# Patient Record
Sex: Female | Born: 1935 | Race: White | Hispanic: No | Smoking: Never smoker
Health system: Southern US, Community
[De-identification: ages and names within clinical notes are randomized; demographics above are authoritative.]

## PROBLEM LIST (undated history)

## (undated) DIAGNOSIS — E0590 Thyrotoxicosis, unspecified without thyrotoxic crisis or storm: Secondary | ICD-10-CM

## (undated) DIAGNOSIS — J45909 Unspecified asthma, uncomplicated: Secondary | ICD-10-CM

## (undated) DIAGNOSIS — C801 Malignant (primary) neoplasm, unspecified: Secondary | ICD-10-CM

## (undated) DIAGNOSIS — I10 Essential (primary) hypertension: Secondary | ICD-10-CM

## (undated) DIAGNOSIS — E785 Hyperlipidemia, unspecified: Secondary | ICD-10-CM

## (undated) DIAGNOSIS — E079 Disorder of thyroid, unspecified: Secondary | ICD-10-CM

## (undated) HISTORY — PX: NASAL ENDOSCOPY: SHX286

## (undated) HISTORY — PX: ABDOMINAL HYSTERECTOMY: SHX81

## (undated) HISTORY — PX: URETHROPEXY: SHX1081

## (undated) HISTORY — DX: Hyperlipidemia, unspecified: E78.5

## (undated) HISTORY — DX: Essential (primary) hypertension: I10

## (undated) HISTORY — DX: Thyrotoxicosis, unspecified without thyrotoxic crisis or storm: E05.90

## (undated) HISTORY — PX: OOPHORECTOMY: SHX86

## (undated) HISTORY — PX: TONSILLECTOMY: SUR1361

## (undated) HISTORY — DX: Disorder of thyroid, unspecified: E07.9

## (undated) HISTORY — PX: NASAL SINUS SURGERY: SHX719

## (undated) HISTORY — PX: PELVIC FLOOR REPAIR: SHX2192

---

## 1998-12-18 ENCOUNTER — Other Ambulatory Visit: Admission: RE | Admit: 1998-12-18 | Discharge: 1998-12-18 | Payer: Self-pay | Admitting: *Deleted

## 1999-08-04 ENCOUNTER — Encounter: Admission: RE | Admit: 1999-08-04 | Discharge: 1999-08-04 | Payer: Self-pay | Admitting: Internal Medicine

## 1999-08-04 ENCOUNTER — Encounter: Payer: Self-pay | Admitting: Internal Medicine

## 2000-08-10 ENCOUNTER — Encounter: Admission: RE | Admit: 2000-08-10 | Discharge: 2000-08-10 | Payer: Self-pay | Admitting: Internal Medicine

## 2000-08-10 ENCOUNTER — Encounter: Payer: Self-pay | Admitting: Internal Medicine

## 2001-08-16 ENCOUNTER — Encounter: Payer: Self-pay | Admitting: Internal Medicine

## 2001-08-16 ENCOUNTER — Encounter: Admission: RE | Admit: 2001-08-16 | Discharge: 2001-08-16 | Payer: Self-pay | Admitting: Internal Medicine

## 2002-02-13 ENCOUNTER — Other Ambulatory Visit: Admission: RE | Admit: 2002-02-13 | Discharge: 2002-02-13 | Payer: Self-pay | Admitting: Family Medicine

## 2002-08-23 ENCOUNTER — Encounter: Payer: Self-pay | Admitting: Family Medicine

## 2002-08-23 ENCOUNTER — Encounter: Admission: RE | Admit: 2002-08-23 | Discharge: 2002-08-23 | Payer: Self-pay | Admitting: Family Medicine

## 2003-11-07 ENCOUNTER — Other Ambulatory Visit: Admission: RE | Admit: 2003-11-07 | Discharge: 2003-11-07 | Payer: Self-pay | Admitting: Family Medicine

## 2003-11-12 ENCOUNTER — Encounter: Admission: RE | Admit: 2003-11-12 | Discharge: 2003-11-12 | Payer: Self-pay | Admitting: Family Medicine

## 2003-12-10 ENCOUNTER — Encounter (INDEPENDENT_AMBULATORY_CARE_PROVIDER_SITE_OTHER): Payer: Self-pay | Admitting: Specialist

## 2003-12-10 ENCOUNTER — Ambulatory Visit (HOSPITAL_BASED_OUTPATIENT_CLINIC_OR_DEPARTMENT_OTHER): Admission: RE | Admit: 2003-12-10 | Discharge: 2003-12-10 | Payer: Self-pay | Admitting: Otolaryngology

## 2003-12-10 ENCOUNTER — Ambulatory Visit (HOSPITAL_COMMUNITY): Admission: RE | Admit: 2003-12-10 | Discharge: 2003-12-10 | Payer: Self-pay | Admitting: Otolaryngology

## 2004-10-08 ENCOUNTER — Ambulatory Visit: Payer: Self-pay | Admitting: Family Medicine

## 2004-11-17 ENCOUNTER — Encounter: Admission: RE | Admit: 2004-11-17 | Discharge: 2004-11-17 | Payer: Self-pay | Admitting: Family Medicine

## 2005-07-06 ENCOUNTER — Ambulatory Visit: Payer: Self-pay | Admitting: Family Medicine

## 2005-07-14 ENCOUNTER — Encounter: Admission: RE | Admit: 2005-07-14 | Discharge: 2005-07-14 | Payer: Self-pay | Admitting: Family Medicine

## 2005-10-13 ENCOUNTER — Ambulatory Visit: Payer: Self-pay | Admitting: Family Medicine

## 2005-11-18 ENCOUNTER — Encounter: Admission: RE | Admit: 2005-11-18 | Discharge: 2005-11-18 | Payer: Self-pay | Admitting: Family Medicine

## 2005-12-07 ENCOUNTER — Ambulatory Visit: Payer: Self-pay | Admitting: Family Medicine

## 2005-12-21 ENCOUNTER — Ambulatory Visit: Payer: Self-pay | Admitting: Internal Medicine

## 2005-12-21 ENCOUNTER — Ambulatory Visit: Payer: Self-pay | Admitting: Family Medicine

## 2006-01-06 ENCOUNTER — Ambulatory Visit: Payer: Self-pay | Admitting: Internal Medicine

## 2006-06-08 ENCOUNTER — Ambulatory Visit: Payer: Self-pay | Admitting: Family Medicine

## 2006-09-27 ENCOUNTER — Ambulatory Visit: Payer: Self-pay | Admitting: Family Medicine

## 2006-11-22 ENCOUNTER — Encounter: Admission: RE | Admit: 2006-11-22 | Discharge: 2006-11-22 | Payer: Self-pay | Admitting: Family Medicine

## 2006-12-25 ENCOUNTER — Emergency Department (HOSPITAL_COMMUNITY): Admission: EM | Admit: 2006-12-25 | Discharge: 2006-12-25 | Payer: Self-pay | Admitting: Family Medicine

## 2007-01-03 ENCOUNTER — Ambulatory Visit: Payer: Self-pay | Admitting: Family Medicine

## 2007-02-14 ENCOUNTER — Ambulatory Visit: Payer: Self-pay | Admitting: Family Medicine

## 2007-02-14 DIAGNOSIS — E7800 Pure hypercholesterolemia, unspecified: Secondary | ICD-10-CM

## 2007-02-15 LAB — CONVERTED CEMR LAB
AST: 17 units/L (ref 0–37)
Total CHOL/HDL Ratio: 4.4
Triglycerides: 84 mg/dL (ref 0–149)
VLDL: 17 mg/dL (ref 0–40)

## 2007-03-14 ENCOUNTER — Encounter: Payer: Self-pay | Admitting: Family Medicine

## 2007-03-14 DIAGNOSIS — T7840XA Allergy, unspecified, initial encounter: Secondary | ICD-10-CM | POA: Insufficient documentation

## 2007-03-14 DIAGNOSIS — J45909 Unspecified asthma, uncomplicated: Secondary | ICD-10-CM | POA: Insufficient documentation

## 2007-03-14 DIAGNOSIS — K219 Gastro-esophageal reflux disease without esophagitis: Secondary | ICD-10-CM

## 2007-03-14 DIAGNOSIS — E039 Hypothyroidism, unspecified: Secondary | ICD-10-CM | POA: Insufficient documentation

## 2007-03-14 DIAGNOSIS — R011 Cardiac murmur, unspecified: Secondary | ICD-10-CM

## 2007-03-14 DIAGNOSIS — E785 Hyperlipidemia, unspecified: Secondary | ICD-10-CM | POA: Insufficient documentation

## 2007-03-15 ENCOUNTER — Ambulatory Visit: Payer: Self-pay | Admitting: Family Medicine

## 2007-03-15 DIAGNOSIS — B0229 Other postherpetic nervous system involvement: Secondary | ICD-10-CM

## 2007-03-15 DIAGNOSIS — G4700 Insomnia, unspecified: Secondary | ICD-10-CM | POA: Insufficient documentation

## 2007-07-25 ENCOUNTER — Ambulatory Visit: Payer: Self-pay | Admitting: Family Medicine

## 2007-07-30 LAB — CONVERTED CEMR LAB
Cholesterol: 246 mg/dL (ref 0–200)
Free T4: 1.1 ng/dL (ref 0.6–1.6)
HDL: 45.4 mg/dL (ref >39.0)
TSH: 3.49 microintl units/mL (ref 0.35–5.50)
Total CHOL/HDL Ratio: 5.4
Triglycerides: 85 mg/dL (ref 0–149)
VLDL: 17 mg/dL (ref 0–40)
Varicella IgG: 3.17 — ABNORMAL HIGH

## 2007-09-26 ENCOUNTER — Ambulatory Visit: Payer: Self-pay | Admitting: Family Medicine

## 2007-09-26 DIAGNOSIS — R319 Hematuria, unspecified: Secondary | ICD-10-CM

## 2007-09-26 LAB — CONVERTED CEMR LAB
Casts: 0 /lpf
Glucose, Urine, Semiquant: NEGATIVE
RBC / HPF: 0
Specific Gravity, Urine: 1.01
pH: 7

## 2007-10-05 ENCOUNTER — Telehealth: Payer: Self-pay | Admitting: Family Medicine

## 2007-11-28 ENCOUNTER — Encounter: Admission: RE | Admit: 2007-11-28 | Discharge: 2007-11-28 | Payer: Self-pay | Admitting: Family Medicine

## 2007-11-29 ENCOUNTER — Encounter (INDEPENDENT_AMBULATORY_CARE_PROVIDER_SITE_OTHER): Payer: Self-pay | Admitting: *Deleted

## 2008-01-02 ENCOUNTER — Ambulatory Visit: Payer: Self-pay | Admitting: Family Medicine

## 2008-01-04 LAB — CONVERTED CEMR LAB
Cholesterol: 222 mg/dL (ref 0–200)
HDL: 56.8 mg/dL (ref >39.0)
Total CHOL/HDL Ratio: 3.9

## 2008-03-12 ENCOUNTER — Telehealth: Payer: Self-pay | Admitting: Family Medicine

## 2008-03-13 ENCOUNTER — Ambulatory Visit: Payer: Self-pay | Admitting: Family Medicine

## 2008-03-13 DIAGNOSIS — L659 Nonscarring hair loss, unspecified: Secondary | ICD-10-CM | POA: Insufficient documentation

## 2008-03-14 LAB — CONVERTED CEMR LAB
T3 Uptake Ratio: 42.3 % — ABNORMAL HIGH (ref 22.5–37.0)
T4, Total: 9.5 ug/dL (ref 5.0–12.5)
TSH: 2.68 microintl units/mL (ref 0.35–5.50)

## 2008-04-09 ENCOUNTER — Encounter: Payer: Self-pay | Admitting: Family Medicine

## 2008-04-11 ENCOUNTER — Encounter: Payer: Self-pay | Admitting: Family Medicine

## 2008-12-03 ENCOUNTER — Encounter: Admission: RE | Admit: 2008-12-03 | Discharge: 2008-12-03 | Payer: Self-pay | Admitting: Internal Medicine

## 2009-01-08 ENCOUNTER — Telehealth: Payer: Self-pay | Admitting: Family Medicine

## 2009-12-09 ENCOUNTER — Encounter: Admission: RE | Admit: 2009-12-09 | Discharge: 2009-12-09 | Payer: Self-pay | Admitting: Internal Medicine

## 2010-05-13 ENCOUNTER — Encounter: Admission: RE | Admit: 2010-05-13 | Discharge: 2010-05-13 | Payer: Self-pay | Admitting: Internal Medicine

## 2010-12-03 ENCOUNTER — Ambulatory Visit (HOSPITAL_BASED_OUTPATIENT_CLINIC_OR_DEPARTMENT_OTHER)
Admission: RE | Admit: 2010-12-03 | Discharge: 2010-12-03 | Disposition: A | Discharge status: Home / Self Care | Payer: Medicare Other | Source: Ambulatory Visit | Attending: Urology | Admitting: Urology

## 2010-12-03 ENCOUNTER — Ambulatory Visit (HOSPITAL_COMMUNITY): Payer: Medicare Other | Attending: Urology

## 2010-12-03 DIAGNOSIS — Z01810 Encounter for preprocedural cardiovascular examination: Secondary | ICD-10-CM | POA: Insufficient documentation

## 2010-12-03 DIAGNOSIS — N952 Postmenopausal atrophic vaginitis: Secondary | ICD-10-CM | POA: Insufficient documentation

## 2010-12-03 DIAGNOSIS — Z79899 Other long term (current) drug therapy: Secondary | ICD-10-CM | POA: Insufficient documentation

## 2010-12-03 DIAGNOSIS — N8189 Other female genital prolapse: Secondary | ICD-10-CM | POA: Insufficient documentation

## 2010-12-03 DIAGNOSIS — N8111 Cystocele, midline: Secondary | ICD-10-CM | POA: Insufficient documentation

## 2010-12-03 DIAGNOSIS — N816 Rectocele: Secondary | ICD-10-CM | POA: Insufficient documentation

## 2010-12-03 DIAGNOSIS — Z90710 Acquired absence of both cervix and uterus: Secondary | ICD-10-CM | POA: Insufficient documentation

## 2010-12-03 DIAGNOSIS — L89899 Pressure ulcer of other site, unspecified stage: Secondary | ICD-10-CM | POA: Insufficient documentation

## 2010-12-03 DIAGNOSIS — R32 Unspecified urinary incontinence: Secondary | ICD-10-CM | POA: Insufficient documentation

## 2010-12-03 DIAGNOSIS — L8990 Pressure ulcer of unspecified site, unspecified stage: Secondary | ICD-10-CM | POA: Insufficient documentation

## 2010-12-03 DIAGNOSIS — N393 Stress incontinence (female) (male): Secondary | ICD-10-CM | POA: Insufficient documentation

## 2010-12-03 DIAGNOSIS — Z01818 Encounter for other preprocedural examination: Secondary | ICD-10-CM | POA: Insufficient documentation

## 2010-12-03 DIAGNOSIS — Z01812 Encounter for preprocedural laboratory examination: Secondary | ICD-10-CM | POA: Insufficient documentation

## 2010-12-04 LAB — BASIC METABOLIC PANEL
BUN: 6 mg/dL (ref 6–23)
CO2: 25 mEq/L (ref 19–32)
Chloride: 103 mEq/L (ref 96–112)
Glucose, Bld: 136 mg/dL — ABNORMAL HIGH (ref 70–99)
Potassium: 4 mEq/L (ref 3.5–5.1)

## 2010-12-04 LAB — HEMOGLOBIN AND HEMATOCRIT, BLOOD
HCT: 33.5 % — ABNORMAL LOW (ref 36.0–46.0)
Hemoglobin: 10.7 g/dL — ABNORMAL LOW (ref 12.0–15.0)

## 2011-01-25 NOTE — Op Note (Signed)
Samantha Hogan, Samantha Hogan                  ACCOUNT NO.:  [id number]  MEDICAL RECORD NO.:  [id number]          PATIENT TYPE:  LOCATION:                                 FACILITY:  PHYSICIAN:  Fairley Copher I. Patsi Sears, M.D. DATE OF BIRTH:  DATE OF PROCEDURE:  12/03/2010 DATE OF DISCHARGE:                              OPERATIVE REPORT   FINAL DIAGNOSES:  Pelvic floor prolapse, including a rectocele, cystocele, and stress urinary incontinence.  OPERATION:  Posterior Pinnacle with sacrospinous fixation with mesh, [employer] Uphold sacrospinous fixation with mesh, Solyx midurethral sling.  SURGEON:  Deundra Bard I. Patsi Sears, M.D.  ANESTHESIA:  General LMA.  PREPARATION:  After appropriate preanesthesia, the patient was brought to the operating room and placed on the operating room table in the dorsal supine position where general LMA anesthesia was introduced.  She was then replaced in the dorsal lithotomy position where the pubis was prepped with Betadine solution and draped in the usual fashion.  REVIEW OF HISTORY:  Samantha Hogan is a 77 year old female, status post hysterectomy in 1980, and Raz urethropexy in 1990.  Currently, she has a grade 3 prolapse with high rectocele or enterocele.  She also has atrophic vaginitis, and a pressure ulcer secondary to the use of a pessary.  She has currently been treated with estrogen cream to resolve this problem.  She is status post anterior and posterior repair per GYN in 1990, but this has recurred.  The patient complains of urinary frequency, urinary incontinence, wearing four pads a day.  On physical examination she has a midline defect, grade 4, consistent with a large cystocele, and a rectocele, or a high enterocele.  The patient had urodynamics, showing that she has a maximum cystometric capacity of 613 cc, with first desire at 318 cc.  She has a maximum bladder contraction pressure of 13 cm of water with no leakage.  When the prolapse  is reduced, however, the patient has somewhat unstable bladder with urinary incontinence, with a flow rate of 4 cc per second, with VCUG showing a flow rate of 17 cc per second.  There is no postvoid residual.  The patient has been counseled extensively with regard to mesh implantation, and it is noted that there is no cervix and no uterus present.  She has been advised to have mesh implantation for repair of her pelvic prolapse.  PROCEDURE IN DETAIL:  A horseshoe retractor was placed, Foley catheter was placed as well.  I have elected to proceed with posterior repair first, and following infusion of Marcaine with epinephrine, I was then able to make a triangular incision at the level of the perineal body, and a midline dissection of the posterior portion of the vaginal epithelium, to approximately 1.5 to 2 cm from the area of the vaginal cuff.  Subcutaneous tissue was then dissected bilaterally and posteriorly.  I was able to dissect through the pelvic floor, and feel the ischial spines bilaterally.  The sacrospinous ligaments were then medial to this.  I was able to dissect the sacrospinous ligaments medially.  Using the Capio device, the posterior Pinnacle was placed bilaterally without  difficulty and no bleeding was noted.  The Posterior Pinnacle mesh was then tensioned appropriately, and the apical portion was sutured with three 2-0 Vicryl sutures.  The distal portion of the mesh was then cut to fit, and following excellent tensioning of the mesh, I sutured the distal portion in position with 2-0 Vicryl suture. The wound edges were freshened, and the posterior wound closed with excellent reduction of the rectocele and possible enterocele with 2-0 Vicryl running suture.  Interrupted Vicryl sutures were also used at the level of the anus.  Attention was then directed to an apparent cystocele, which then was noted after the rectocele was repaired, and level I posterior  support recovered.  Again, 2% Marcaine with epinephrine was injected with hydrodissection back to the level of the ischial spines.  A horizontal incision was made beneath the level of the Foley catheter, and subcutaneous tissue dissected to the ischial spines.  Again, the ischial spines were dissected, and the sacrospinous ligaments were dissected. The previously placed Capio sutures were identified medially on the sacrospinous ligament, and somewhat more lateralward, one fingerbreadth medial to the ischial spines, were placed the Uphold sutures.  The Uphold mesh was then placed without difficulty, fitted in position in the anterior vault, with excellent reduction of the prolapse.  The mesh was then sutured in place with 2-0 Vicryl sutures, and no wrinkles were noted.  It was tensioned appropriately.  The arms were excised.  The wound was then closed with a running 2-0 Vicryl suture.  Inspection revealed no bleeding, and all limbs of both posterior and anterior repairs had been excised.  No mesh was identifiable.  I then used Marcaine 0.25 with epinephrine to hydrodissect the midurethral area for Solyx sling.  A 15-mm incision was then made in the midurethra, after marking it with a blue pen.  Following this, dissection was accomplished at both right and left sides to the level of the obturator internus, and the Solyx midurethral sling was then placed in the right side first, across the midline of the sling which was previously marked with a blue pen.  The left side was then placed on the obturator internus.  Cystoscopy revealed no evidence of any problem with the bladder, and the scope was removed.  Foley catheter was replaced. The midurethral incision was sutured closed with 2-0 Vicryl suture.  The patient was then awakened and taken to the recovery room in good condition.     Samantha Hogan I. Patsi Sears, M.D.     SIT/MEDQ  D:  01/21/2011  T:  01/21/2011  Job:   [id number]  Electronically Signed by Jethro Bolus M.D. on 01/25/2011 03:05:33 PM

## 2011-02-25 NOTE — Op Note (Signed)
NAME:  Samantha Hogan, Samantha Hogan                            ACCOUNT NO.:  [id number]   MEDICAL RECORD NO.:  [id number]                   PATIENT TYPE:  AMB   LOCATION:  DSC                                  FACILITY:  [facility]   PHYSICIAN:  Kathy Breach, M.D.                   DATE OF BIRTH:  [birth year]   DATE OF PROCEDURE:  12/10/2003  DATE OF DISCHARGE:                                 OPERATIVE REPORT   PREOPERATIVE DIAGNOSIS:  Bilateral nasal/sinus polyps.   POSTOPERATIVE DIAGNOSIS:  1. Bilateral endoscopic anterior ethmoidectomies.  2. Bilateral endoscopic osteomeatal windows.  3. Bilateral endoscopic nasal polypectomies.   PROCEDURE:  1. Bilateral endoscopic anterior ethmoidectomies.  2. Bilateral endoscopic osteomeatal windows.  3. Bilateral endoscopic nasal polypectomies.   DESCRIPTION OF PROCEDURE:  With the patient under general orotracheal  anesthesia, a nasal block was applied with 4% Xylocaine with epinephrine-  soaked cotton-tipped probes to the sphenopalatine and anterior ethmoid nerve  areas bilaterally.  The mucosa of the middle meatus and meatal surface of  the middle turbinates was infiltrated with 1% Xylocaine with 1:100,000  epinephrine.  Inspection of the patient's nose with it completely  vasoconstricted revealed mucoid polyps in the middle meatus, larger on the  right side than the left, but both hanging down into the nasal chamber well  below the level of the margin of the middle turbinate and polypoid in  attachment.  Under endoscopic visualization, initially on the right side,  the nasal polyps in the middle meatus and nasal chamber were grasped at  their origins and removed.  Continuing on the right side with endoscopic  visualization, the middle turbinate was medially fractured for better  visualization of the middle meatus. The infundibulum was identified.  A  sickle knife was used to make an incision along the inferior and anterior  aspect of the infundibulum which  was then removed exposing the hiatus  semilunaris.  The mucosa in the area was markedly polypoid degeneration.  With Wilde forceps, the anterior ethmoid cells, bulla ethmoidalis initially,  and surrounding cells were removed, all filled with polyp tissue.  The area  of the ostium into the maxillary sinus was located probing with curved  suction and entered into the sinus, and the window was enlarged to  approximately 8 mm to 1 cm in size.  There was no significant bleeding  throughout the procedure or persisting.  An identical procedure with  identical findings repeated on the left, completing endoscopic anterior  ethmoidectomies back to the lamina propria bilaterally, constituting doing  anterior ethmoidectomy and osteomeatal windows bilaterally.  There was no  significant bleeding at the completion of the case.  The patient's  oropharynx was clear.  The patient tolerated the procedure well.  Blood loss  estimated at less the 10-15 cc.  Kathy Breach, M.D.    Venia Minks  D:  12/10/2003  T:  12/10/2003  Job:  [id number]

## 2011-04-27 ENCOUNTER — Other Ambulatory Visit: Payer: Self-pay

## 2011-06-15 ENCOUNTER — Other Ambulatory Visit: Payer: Self-pay

## 2011-07-25 ENCOUNTER — Other Ambulatory Visit: Payer: Self-pay | Admitting: Internal Medicine

## 2011-07-25 DIAGNOSIS — Z1231 Encounter for screening mammogram for malignant neoplasm of breast: Secondary | ICD-10-CM

## 2011-08-17 ENCOUNTER — Ambulatory Visit
Admission: RE | Admit: 2011-08-17 | Discharge: 2011-08-17 | Disposition: A | Discharge status: Home / Self Care | Payer: Medicare Other | Source: Ambulatory Visit | Attending: Internal Medicine | Admitting: Internal Medicine

## 2011-08-17 DIAGNOSIS — Z1231 Encounter for screening mammogram for malignant neoplasm of breast: Secondary | ICD-10-CM

## 2013-07-01 ENCOUNTER — Other Ambulatory Visit: Payer: Self-pay

## 2013-07-01 DIAGNOSIS — Z1231 Encounter for screening mammogram for malignant neoplasm of breast: Secondary | ICD-10-CM

## 2013-07-23 ENCOUNTER — Ambulatory Visit
Admission: RE | Admit: 2013-07-23 | Discharge: 2013-07-23 | Disposition: A | Discharge status: Home / Self Care | Payer: Medicare Other | Source: Ambulatory Visit

## 2013-07-23 DIAGNOSIS — Z1231 Encounter for screening mammogram for malignant neoplasm of breast: Secondary | ICD-10-CM

## 2014-08-27 ENCOUNTER — Ambulatory Visit: Payer: Self-pay | Admitting: Podiatry

## 2014-12-02 ENCOUNTER — Encounter: Payer: Self-pay | Admitting: Podiatry

## 2014-12-02 ENCOUNTER — Ambulatory Visit (INDEPENDENT_AMBULATORY_CARE_PROVIDER_SITE_OTHER): Payer: Medicare Other | Admitting: Podiatry

## 2014-12-02 VITALS — BP 144/81 | HR 85 | Resp 16 | Ht 62.0 in | Wt 135.0 lb

## 2014-12-02 DIAGNOSIS — B351 Tinea unguium: Secondary | ICD-10-CM | POA: Diagnosis not present

## 2014-12-02 DIAGNOSIS — L600 Ingrowing nail: Secondary | ICD-10-CM | POA: Diagnosis not present

## 2014-12-02 NOTE — Progress Notes (Signed)
Subjective:     Patient ID: Samantha Hogan, female   DOB: Aug 09, 1932, 81 y.o.   MRN: [id number]  HPI patient presents stating she has nails that are thick on both feet and painful she cannot cut them and she wants to know if there anything we can do permanent   Review of Systems  All other systems reviewed and are negative.      Objective:   Physical Exam  Constitutional: She is oriented to person, place, and time.  Cardiovascular: Intact distal pulses.   Musculoskeletal: Normal range of motion.  Neurological: She is oriented to person, place, and time.  Skin: Skin is warm.  Nursing note and vitals reviewed.  neurovascular status found to be intact with muscle strength adequate and range of motion subtalar midtarsal joint within normal limits. Patient's noted to have thick incurvated second nails both feet with pain on the dorsal medial and lateral surface and slight thickness of the big toenails and fifth nails that are not painful currently. Noted to have good digital perfusion and is well oriented     Assessment:     Damaged second nails of both feet with thickness incurvation and pain when pressed    Plan:     H&P and condition discussed explained with patient. Reviewed all different treatment options and she is opted for removal and at this time I explained risk. Patient wants surgery and today I infiltrated each second toe 60 mg Xylocaine Marcaine mixture after sterile prep I removed the nails exposed the matrix and applied phenol for applications 30 seconds followed by alcohol lavaged and sterile dressing. Gave instructions on soaks and reappoint

## 2014-12-02 NOTE — Patient Instructions (Signed)

## 2014-12-02 NOTE — Progress Notes (Signed)
   Subjective:    Patient ID: Samantha Hogan, female    DOB: [birth year], 81 y.o.   MRN: [id number]  HPI Comments: "I have a problem with this toenails"  Patient c/o thick, discolored nails, 1st and 2nd nails bilateral, for several years. The 2nd toenails are sore with socks and shoes. No home treatment.     Review of Systems  Genitourinary: Positive for frequency.  Musculoskeletal: Positive for back pain.  Skin:       Change in nails       Objective:   Physical Exam        Assessment & Plan:

## 2014-12-03 ENCOUNTER — Telehealth: Payer: Self-pay | Admitting: *Deleted

## 2014-12-03 NOTE — Telephone Encounter (Signed)
I had 2 toenails removed yesterday by Dr. Paulla Dolly, I wanted to know if they should be bleeding and if I'm treating them right. I instructed pt to either soak or cleanse the toenail areas daily and cover the toenail areas with antibiotic ointment bandaid after the soak/cleanses for 4-6 weeks or until the area were covered with a dry hard scab without redness, or drainage.  Pt states understanding.

## 2015-01-08 ENCOUNTER — Encounter: Payer: Self-pay | Admitting: Podiatry

## 2015-01-08 ENCOUNTER — Ambulatory Visit (INDEPENDENT_AMBULATORY_CARE_PROVIDER_SITE_OTHER): Payer: Medicare Other | Admitting: Podiatry

## 2015-01-08 DIAGNOSIS — L03039 Cellulitis of unspecified toe: Secondary | ICD-10-CM

## 2015-01-08 DIAGNOSIS — L600 Ingrowing nail: Secondary | ICD-10-CM

## 2015-01-08 DIAGNOSIS — IMO0002 Reserved for concepts with insufficient information to code with codable children: Secondary | ICD-10-CM

## 2015-01-08 NOTE — Progress Notes (Signed)
Subjective:     Patient ID: Samantha Hogan, female   DOB: [birth year], 81 y.o.   MRN: [id number]  HPI patient states I was just worried about these 2 toenails and whether I have infection after removal one month ago   Review of Systems     Objective:   Physical Exam Her vascular status intact with crusted second nailbeds bilateral that are localized in nature with no proximal edema erythema drainage noted    Assessment:     Healing well from ingrown toenail remove the second bilateral    Plan:     Explained normal nature of crusted tissue and explained continued soaks bandage treatment and allowing them to air dry at night

## 2015-04-22 ENCOUNTER — Other Ambulatory Visit: Payer: Self-pay | Admitting: Specialist

## 2016-01-20 ENCOUNTER — Encounter: Payer: Self-pay | Admitting: Podiatry

## 2016-01-20 ENCOUNTER — Ambulatory Visit (INDEPENDENT_AMBULATORY_CARE_PROVIDER_SITE_OTHER): Payer: Medicare Other | Admitting: Podiatry

## 2016-01-20 VITALS — BP 152/72 | HR 72 | Resp 16

## 2016-01-20 DIAGNOSIS — B351 Tinea unguium: Secondary | ICD-10-CM

## 2016-01-20 NOTE — Progress Notes (Signed)
Subjective:     Patient ID: Samantha Hogan, female   DOB: 17-Feb-1932, 82 y.o.   MRN: [id number]  HPI patient presents stating she's concerned because she's got some thickness of her big toenail and she is worried about fungus or ingrown toenail   Review of Systems     Objective:   Physical Exam Neurovascular status intact muscle strength adequate with yellow discoloration of the medial side right hallux extending distal localized with minimal discomfort when palpated    Assessment:     Probable commentation of trauma with mycotic nail infection right    Plan:     H&P condition reviewed and we will start topical anti-fungal consisting of formula 3 discussed possibility for laser or oral treatment if it does not respond well. Hopefully we'll grow out over the next few months but if it does not patient will reappoint

## 2016-07-20 ENCOUNTER — Other Ambulatory Visit: Payer: Self-pay | Admitting: Neurology

## 2016-07-20 DIAGNOSIS — R2689 Other abnormalities of gait and mobility: Secondary | ICD-10-CM

## 2016-07-28 ENCOUNTER — Ambulatory Visit
Admission: RE | Admit: 2016-07-28 | Discharge: 2016-07-28 | Disposition: A | Discharge status: Home / Self Care | Payer: Medicare Other | Source: Ambulatory Visit | Attending: Neurology | Admitting: Neurology

## 2016-07-28 ENCOUNTER — Encounter: Payer: Self-pay | Admitting: Radiology

## 2016-07-28 DIAGNOSIS — I6782 Cerebral ischemia: Secondary | ICD-10-CM | POA: Insufficient documentation

## 2016-07-28 DIAGNOSIS — J320 Chronic maxillary sinusitis: Secondary | ICD-10-CM | POA: Insufficient documentation

## 2016-07-28 DIAGNOSIS — R2689 Other abnormalities of gait and mobility: Secondary | ICD-10-CM

## 2016-07-28 DIAGNOSIS — J328 Other chronic sinusitis: Secondary | ICD-10-CM | POA: Diagnosis not present

## 2018-02-02 ENCOUNTER — Encounter (HOSPITAL_COMMUNITY): Payer: Self-pay | Admitting: Student

## 2018-02-02 ENCOUNTER — Emergency Department (HOSPITAL_COMMUNITY): Payer: Medicare Other

## 2018-02-02 ENCOUNTER — Other Ambulatory Visit: Payer: Self-pay

## 2018-02-02 ENCOUNTER — Inpatient Hospital Stay (HOSPITAL_COMMUNITY)
Admission: EM | Admit: 2018-02-02 | Discharge: 2018-02-06 | DRG: 493 | Disposition: A | Discharge status: Skilled Nursing Facility | Payer: Medicare Other | Attending: Student | Admitting: Student

## 2018-02-02 ENCOUNTER — Inpatient Hospital Stay (HOSPITAL_COMMUNITY): Payer: Medicare Other

## 2018-02-02 DIAGNOSIS — Z419 Encounter for procedure for purposes other than remedying health state, unspecified: Secondary | ICD-10-CM

## 2018-02-02 DIAGNOSIS — E878 Other disorders of electrolyte and fluid balance, not elsewhere classified: Secondary | ICD-10-CM | POA: Diagnosis present

## 2018-02-02 DIAGNOSIS — G4700 Insomnia, unspecified: Secondary | ICD-10-CM | POA: Diagnosis present

## 2018-02-02 DIAGNOSIS — S82141A Displaced bicondylar fracture of right tibia, initial encounter for closed fracture: Secondary | ICD-10-CM | POA: Diagnosis present

## 2018-02-02 DIAGNOSIS — M810 Age-related osteoporosis without current pathological fracture: Secondary | ICD-10-CM | POA: Diagnosis present

## 2018-02-02 DIAGNOSIS — Z90710 Acquired absence of both cervix and uterus: Secondary | ICD-10-CM | POA: Diagnosis not present

## 2018-02-02 DIAGNOSIS — E785 Hyperlipidemia, unspecified: Secondary | ICD-10-CM | POA: Diagnosis present

## 2018-02-02 DIAGNOSIS — T148XXA Other injury of unspecified body region, initial encounter: Secondary | ICD-10-CM

## 2018-02-02 DIAGNOSIS — S82142A Displaced bicondylar fracture of left tibia, initial encounter for closed fracture: Secondary | ICD-10-CM | POA: Diagnosis present

## 2018-02-02 DIAGNOSIS — Z888 Allergy status to other drugs, medicaments and biological substances status: Secondary | ICD-10-CM

## 2018-02-02 DIAGNOSIS — D72829 Elevated white blood cell count, unspecified: Secondary | ICD-10-CM | POA: Diagnosis not present

## 2018-02-02 DIAGNOSIS — E871 Hypo-osmolality and hyponatremia: Secondary | ICD-10-CM | POA: Diagnosis present

## 2018-02-02 DIAGNOSIS — Z01818 Encounter for other preprocedural examination: Secondary | ICD-10-CM

## 2018-02-02 DIAGNOSIS — Z881 Allergy status to other antibiotic agents status: Secondary | ICD-10-CM

## 2018-02-02 DIAGNOSIS — K219 Gastro-esophageal reflux disease without esophagitis: Secondary | ICD-10-CM | POA: Diagnosis present

## 2018-02-02 DIAGNOSIS — M25562 Pain in left knee: Secondary | ICD-10-CM

## 2018-02-02 DIAGNOSIS — Z79899 Other long term (current) drug therapy: Secondary | ICD-10-CM

## 2018-02-02 DIAGNOSIS — E039 Hypothyroidism, unspecified: Secondary | ICD-10-CM | POA: Diagnosis present

## 2018-02-02 DIAGNOSIS — E7800 Pure hypercholesterolemia, unspecified: Secondary | ICD-10-CM | POA: Diagnosis present

## 2018-02-02 DIAGNOSIS — S82492A Other fracture of shaft of left fibula, initial encounter for closed fracture: Secondary | ICD-10-CM | POA: Diagnosis not present

## 2018-02-02 DIAGNOSIS — Z79890 Hormone replacement therapy (postmenopausal): Secondary | ICD-10-CM | POA: Diagnosis not present

## 2018-02-02 DIAGNOSIS — Z85828 Personal history of other malignant neoplasm of skin: Secondary | ICD-10-CM | POA: Diagnosis not present

## 2018-02-02 DIAGNOSIS — W07XXXA Fall from chair, initial encounter: Secondary | ICD-10-CM | POA: Diagnosis present

## 2018-02-02 DIAGNOSIS — Z880 Allergy status to penicillin: Secondary | ICD-10-CM

## 2018-02-02 DIAGNOSIS — I10 Essential (primary) hypertension: Secondary | ICD-10-CM | POA: Diagnosis present

## 2018-02-02 DIAGNOSIS — S82832A Other fracture of upper and lower end of left fibula, initial encounter for closed fracture: Secondary | ICD-10-CM

## 2018-02-02 DIAGNOSIS — J45909 Unspecified asthma, uncomplicated: Secondary | ICD-10-CM | POA: Diagnosis present

## 2018-02-02 HISTORY — DX: Unspecified asthma, uncomplicated: J45.909

## 2018-02-02 HISTORY — DX: Malignant (primary) neoplasm, unspecified: C80.1

## 2018-02-02 LAB — CBC WITH DIFFERENTIAL/PLATELET
BASOS ABS: 0 10*3/uL (ref 0.0–0.1)
Basophils Relative: 0 %
Eosinophils Absolute: 0 10*3/uL (ref 0.0–0.7)
Eosinophils Relative: 0 %
HEMATOCRIT: 38 % (ref 36.0–46.0)
HEMOGLOBIN: 12.4 g/dL (ref 12.0–15.0)
LYMPHS ABS: 1.8 10*3/uL (ref 0.7–4.0)
Lymphocytes Relative: 12 %
MCH: 26.4 pg (ref 26.0–34.0)
MCHC: 32.6 g/dL (ref 30.0–36.0)
MCV: 80.9 fL (ref 78.0–100.0)
Monocytes Absolute: 1.1 10*3/uL — ABNORMAL HIGH (ref 0.1–1.0)
Monocytes Relative: 8 %
NEUTROS ABS: 11.4 10*3/uL — AB (ref 1.7–7.7)
NEUTROS PCT: 80 %
PLATELETS: 308 10*3/uL (ref 150–400)
RBC: 4.7 MIL/uL (ref 3.87–5.11)
RDW: 13.2 % (ref 11.5–15.5)
WBC: 14.4 10*3/uL — AB (ref 4.0–10.5)

## 2018-02-02 LAB — BASIC METABOLIC PANEL
ANION GAP: 13 (ref 5–15)
BUN: 23 mg/dL — ABNORMAL HIGH (ref 6–20)
CHLORIDE: 99 mmol/L — AB (ref 101–111)
CO2: 22 mmol/L (ref 22–32)
Calcium: 9 mg/dL (ref 8.9–10.3)
Creatinine, Ser: 0.96 mg/dL (ref 0.44–1.00)
GFR calc Af Amer: >60 mL/min (ref >60)
GFR calc non Af Amer: 53 mL/min — ABNORMAL LOW (ref >60)
GLUCOSE: 103 mg/dL — AB (ref 65–99)
POTASSIUM: 4 mmol/L (ref 3.5–5.1)
Sodium: 134 mmol/L — ABNORMAL LOW (ref 135–145)

## 2018-02-02 LAB — TYPE AND SCREEN
ABO/RH(D): B NEG
Antibody Screen: NEGATIVE

## 2018-02-02 LAB — ABO/RH: ABO/RH(D): B NEG

## 2018-02-02 MED ORDER — ONDANSETRON HCL 4 MG/2ML IJ SOLN
4.0000 mg | Freq: Four times a day (QID) | INTRAMUSCULAR | Status: DC | PRN
  Filled 2018-02-02: qty 2

## 2018-02-02 MED ORDER — TRAMADOL HCL 50 MG PO TABS
50.0000 mg | ORAL_TABLET | Freq: Four times a day (QID) | ORAL | Status: DC | PRN
  Administered 2018-02-02 – 2018-02-05 (×6): 50 mg via ORAL
  Filled 2018-02-02 (×6): qty 1

## 2018-02-02 MED ORDER — VITAMIN B 12 100 MCG PO LOZG: LOZENGE | Freq: Every day | ORAL | Status: DC

## 2018-02-02 MED ORDER — METOPROLOL TARTRATE 25 MG PO TABS: 25.0000 mg | ORAL_TABLET | Freq: Two times a day (BID) | ORAL | Status: DC

## 2018-02-02 MED ORDER — ADULT MULTIVITAMIN W/MINERALS CH
ORAL_TABLET | Freq: Every day | ORAL | Status: DC
  Administered 2018-02-05: 1 via ORAL
  Filled 2018-02-02 (×2): qty 1

## 2018-02-02 MED ORDER — GABAPENTIN 300 MG PO CAPS: 600.0000 mg | ORAL_CAPSULE | Freq: Every day | ORAL | Status: DC | PRN

## 2018-02-02 MED ORDER — ACETAMINOPHEN 325 MG PO TABS: 650.0000 mg | ORAL_TABLET | Freq: Four times a day (QID) | ORAL | Status: DC | PRN

## 2018-02-02 MED ORDER — LEVOTHYROXINE SODIUM 25 MCG PO TABS
25.0000 ug | ORAL_TABLET | Freq: Every day | ORAL | Status: DC
  Administered 2018-02-03 – 2018-02-06 (×3): 25 ug via ORAL
  Filled 2018-02-02 (×3): qty 1

## 2018-02-02 MED ORDER — HYDROCODONE-ACETAMINOPHEN 5-325 MG PO TABS
1.0000 | ORAL_TABLET | Freq: Four times a day (QID) | ORAL | Status: DC | PRN
  Administered 2018-02-02: 1 via ORAL
  Administered 2018-02-05: 2 via ORAL
  Administered 2018-02-05 – 2018-02-06 (×2): 1 via ORAL
  Filled 2018-02-02: qty 2
  Filled 2018-02-02: qty 1
  Filled 2018-02-02: qty 2
  Filled 2018-02-02: qty 1

## 2018-02-02 MED ORDER — AMLODIPINE-ATORVASTATIN 5-40 MG PO TABS: 1.0000 | ORAL_TABLET | Freq: Every day | ORAL | Status: DC

## 2018-02-02 MED ORDER — ENOXAPARIN SODIUM 40 MG/0.4ML ~~LOC~~ SOLN
40.0000 mg | SUBCUTANEOUS | Status: DC
  Administered 2018-02-02 – 2018-02-05 (×4): 40 mg via SUBCUTANEOUS
  Filled 2018-02-02 (×4): qty 0.4

## 2018-02-02 MED ORDER — CELECOXIB 200 MG PO CAPS
200.0000 mg | ORAL_CAPSULE | Freq: Every day | ORAL | Status: DC | PRN
  Filled 2018-02-02: qty 1

## 2018-02-02 MED ORDER — FENTANYL CITRATE (PF) 100 MCG/2ML IJ SOLN: 50.0000 ug | INTRAMUSCULAR | Status: DC | PRN

## 2018-02-02 MED ORDER — DOCUSATE SODIUM 100 MG PO CAPS
100.0000 mg | ORAL_CAPSULE | Freq: Two times a day (BID) | ORAL | Status: DC
  Administered 2018-02-03 – 2018-02-06 (×4): 100 mg via ORAL
  Filled 2018-02-02 (×5): qty 1

## 2018-02-02 NOTE — ED Provider Notes (Addendum)
Care assumed from [facility], PA-C, at shift change, please see their notes for full documentation of patient's complaint/HPI. Briefly, pt here with mechanical fall. Results so far show L tibial plateau fx and fibular neck fx. Plan is to transfer for admission at [facility] under Dr. Doreatha Martin.   Physical Exam  BP (!) 143/78 (BP Location: Left Arm)   Pulse 87   Temp 98 F (36.7 C) (Oral)   Resp 16   Ht 5' 2.5" (1.588 m)   Wt 61.2 kg (135 lb)   SpO2 98%   BMI 24.30 kg/m   Physical Exam Gen: afebrile, VSS, NAD HEENT: EOMI, MMM Resp: no resp distress CV: rate WNL Abd: appearance normal, nondistended MsK: L leg in knee immobilizer Neuro: A&O  ED Course/Procedures     Results for orders placed or performed during the hospital encounter of 02/02/18  CBC with Differential  Result Value Ref Range   WBC 14.4 (H) 4.0 - 10.5 K/uL   RBC 4.70 3.87 - 5.11 MIL/uL   Hemoglobin 12.4 12.0 - 15.0 g/dL   HCT 38.0 36.0 - 46.0 %   MCV 80.9 78.0 - 100.0 fL   MCH 26.4 26.0 - 34.0 pg   MCHC 32.6 30.0 - 36.0 g/dL   RDW 13.2 11.5 - 15.5 %   Platelets 308 150 - 400 K/uL   Neutrophils Relative % 80 %   Neutro Abs 11.4 (H) 1.7 - 7.7 K/uL   Lymphocytes Relative 12 %   Lymphs Abs 1.8 0.7 - 4.0 K/uL   Monocytes Relative 8 %   Monocytes Absolute 1.1 (H) 0.1 - 1.0 K/uL   Eosinophils Relative 0 %   Eosinophils Absolute 0.0 0.0 - 0.7 K/uL   Basophils Relative 0 %   Basophils Absolute 0.0 0.0 - 0.1 K/uL  Basic metabolic panel  Result Value Ref Range   Sodium 134 (L) 135 - 145 mmol/L   Potassium 4.0 3.5 - 5.1 mmol/L   Chloride 99 (L) 101 - 111 mmol/L   CO2 22 22 - 32 mmol/L   Glucose, Bld 103 (H) 65 - 99 mg/dL   BUN 23 (H) 6 - 20 mg/dL   Creatinine, Ser 0.96 0.44 - 1.00 mg/dL   Calcium 9.0 8.9 - 10.3 mg/dL   GFR calc non Af Amer 53 (L) >60 mL/min   GFR calc Af Amer >60 >60 mL/min   Anion gap 13 5 - 15   Dg Knee 1-2 Views Left  Result Date: 02/02/2018 CLINICAL DATA:  Recent fall with knee  deformity, initial encounter EXAM: LEFT KNEE - 1-2 VIEW COMPARISON:  None. FINDINGS: Comminuted fracture of the proximal tibia is noted with impaction at the fracture site. Fracture lines extend into the articular surface laterally. Fat fluid effusion is noted. Proximal fibular fracture is noted as well. IMPRESSION: Proximal tibial and fibular fractures. Electronically Signed   By: Inez Catalina M.D.   On: 02/02/2018 15:30   Dg Tibia/fibula Left  Result Date: 02/02/2018 CLINICAL DATA:  84 year old female status post fall with left knee pain and deformity EXAM: LEFT TIBIA AND FIBULA - 2 VIEW COMPARISON:  Concurrently obtained radiographs of the left knee FINDINGS: Acute comminuted tibial plateau fracture and acute fracture of the fibular neck. No evidence of additional fracture in the mid or distal lower leg. The ankle joint appears intact. No focal soft tissue abnormality. IMPRESSION: Acute comminuted tibial plateau fracture. Acute impacted fracture of the fibular neck. Electronically Signed   By: Dellis Filbert.D.  On: 02/02/2018 15:28     No orders of the defined types were placed in this encounter.    MDM:   ICD-10-CM   1. Closed fracture of left tibial plateau, initial encounter S82.142A   2. Left knee pain M25.562 DG Knee 1-2 Views Left    DG Knee 1-2 Views Left  3. Closed fracture of neck of left fibula, initial encounter E72.094B    5:04 PM- Dr. Doreatha Martin accepting admission at [facility]. Will transfer. Temp admit orders in place. Remainder of orders to be placed by admitting team. Please see their notes for further documentation of care. I appreciate their help with this pleasant pt's care. Pt stable at time of admission and transfer, declining wanting anything at this time.    [address] [place], [place] 02/02/18 1705  ADDENDUM 11:07 PM: Pt still here; Dr. Doreatha Martin saw her and put holding orders on her, including pain meds. Pt very comfortable and has no concerns or requests at this time.  She's been fed. Apparently she'll have surgery Sunday, so I suspect she can eat/drink until tomorrow (Saturday) at midnight, but any questions regarding this should be asked of Dr. Doreatha Martin-- made nursing aware of that as well. Pt continues to remain stable and will be transferred as soon as a bed comes available at [facility]. Please see admission team's notes for further documentation of care.     [address], [place], [place] 02/02/18 2309    Drenda Freeze, MD 02/03/18 [phone]

## 2018-02-02 NOTE — H&P (Signed)
Orthopaedic Trauma Service (OTS) Consult   Patient ID: CHALISA KOBLER MRN: [id number] DOB/AGE: 82-Jun-1933 82 y.o.  Reason for Consult:Left tibial plateau fracture Referring Physician: Shirlyn Goltz, MD [facility]  HPI: VALENA IVANOV is an 84 y.o. female who is being seen in consultation at the request of Dr. Corlis Leak for evaluation of left tibial plateau fracture.  This 84 year old female who is otherwise healthy who does not use an ambulatory device.  She was on her chair changing checking her air du when shect lost her balance and fell off and landed on her left leg.  She had immediate pain and deformity and inability to bear weight.  She was brought into the hospital where x-rays showed a displaced bicondylar tibial plateau fracture was consulted for evaluation.  She has no significant medical history.  She was seen with her son at bedside in the [facility] emergency room.  Denies any other injury.  Denies any numbness or tingling.  Past Medical History:  Diagnosis Date  . Asthma   . Cancer ([place])   . Carcinoma ([place])    basil cell    Past Surgical History:  Procedure Laterality Date  . ABDOMINAL HYSTERECTOMY    . NASAL ENDOSCOPY    . PELVIC FLOOR REPAIR    . TONSILLECTOMY      History reviewed. No pertinent family history.  Social History:  reports that she has never smoked. She has never used smokeless tobacco. She reports that she does not drink alcohol or use drugs.  Allergies:  Allergies  Allergen Reactions  . Amoxicillin     REACTION: rash Has patient had a PCN reaction causing immediate rash, facial/tongue/throat swelling, SOB or lightheadedness with hypotension: No Has patient had a PCN reaction causing severe rash involving mucus membranes or skin necrosis: Yes Has patient had a PCN reaction that required hospitalization: No Has patient had a PCN reaction occurring within the last 10 years-No If all of the above answers are "NO", then may proceed with Cephalosporin use.    . Atorvastatin     REACTION: muscle pain  . Conjugated Estrogens   . Fenofibrate     REACTION: muscle pain  . Pravastatin Sodium     REACTION: insomnia  . Propoxyphene Hcl     REACTION: nausea  . Rosuvastatin     REACTION: muscle pain  . Penicillins Rash    Medications:  No current facility-administered medications on file prior to encounter.    Current Outpatient Medications on File Prior to Encounter  Medication Sig Dispense Refill  . amLODipine-atorvastatin (CADUET) 5-40 MG tablet Take 1 tablet by mouth daily.    . celecoxib (CELEBREX) 200 MG capsule Take 200 mg by mouth daily as needed for pain.  0  . Cholecalciferol (VITAMIN D-3 PO) Take 1 tablet by mouth at bedtime.     . Cyanocobalamin (VITAMIN B 12 PO) Place 1 each under the tongue at bedtime.    . gabapentin (NEURONTIN) 600 MG tablet Take 600 mg by mouth daily as needed (pain.).    Marland Kitchen Garlic (GARLIQUE PO) Take 1 tablet by mouth 2 (two) times a week.     . levothyroxine (SYNTHROID, LEVOTHROID) 25 MCG tablet Take 25 mcg by mouth daily before breakfast.    . Multiple Vitamins-Minerals (CENTRUM SILVER 50+WOMEN PO) Take 1 tablet by mouth at bedtime.    . metoprolol tartrate (LOPRESSOR) 25 MG tablet Take 25 mg by mouth 2 (two) times daily.      ROS: Constitutional:  No fever or chills Vision: No changes in vision ENT: No difficulty swallowing CV: No chest pain Pulm: No SOB or wheezing GI: No nausea or vomiting GU: No urgency or inability to hold urine Skin: No poor wound healing Neurologic: No numbness or tingling Psychiatric: No depression or anxiety Heme: No bruising Allergic: No reaction to medications or food   Exam: Blood pressure (!) 164/78, pulse 91, temperature 98 F (36.7 C), temperature source Oral, resp. rate 15, height 5' 2.5" (1.588 m), weight 61.2 kg (135 lb), SpO2 98 %. General: No acute distress  Orientation: awake alert and oriented x3 Mood and Affect: Cooperative and pleasant Gait: Not able to be  assessed due to her fracture Coordination and balance: Within normal limits  Left lower extremity: Reveals a skin without lesions.  There is significant swelling about the knee.  Her lateral compartment is taut but compressible.  The remainder of his leg is soft and compressible.  Did not tolerate any knee range of motion.  Ankle range of motion without instability or deformity.  She is motor and sensory function to all nerve distributions.  She is warm well-perfused foot with 2+ DP pulse.  No lymphadenopathy and normal reflexes.  Right lower extremity and bilateral upper extremities:skin without lesions. No tenderness to palpation. Full painless ROM, full strength in each muscle groups without evidence of instability.   Medical Decision Making: Imaging: X-rays and CT scan are reviewed which shows a displaced bicondylar tibial plateau fracture with significant lateral joint involvement.  She has significant osteoporosis with impaction of the metaphysis overall x-rays appear in relatively decent alignment  Labs:  CBC    Component Value Date/Time   WBC 6.6 02/03/2018 0336   RBC 3.93 02/03/2018 0336   HGB 10.4 (L) 02/03/2018 0336   HCT 31.7 (L) 02/03/2018 0336   PLT 283 02/03/2018 0336   MCV 80.7 02/03/2018 0336   MCH 26.5 02/03/2018 0336   MCHC 32.8 02/03/2018 0336   RDW 13.1 02/03/2018 0336   LYMPHSABS 1.8 02/02/2018 1513   MONOABS 1.1 (H) 02/02/2018 1513   EOSABS 0.0 02/02/2018 1513   BASOSABS 0.0 02/02/2018 1513   Assessment/Plan: 84 year old female without significant past medical history with a displaced left by color tibial plateau fractureMedical history and chart was reviewed  Due to the displaced nature and significant joint involvement I feel that open reduction internal fixation is most appropriate.  I do not feel that the patient would have a good outcome with nonoperative treatment.  I discussed the risks and benefits with her and her son. Risks discussed included bleeding  requiring blood transfusion, bleeding causing a hematoma, infection, malunion, nonunion, damage to surrounding nerves and blood vessels, pain, hardware prominence or irritation, hardware failure, stiffness, post-traumatic arthritis, DVT/PE, compartment syndrome, and even death.  I will plan to put her on the surgery schedule for Sunday, April 28.  NPO past midnight Saturday night   Shona Needles, MD [facility] [phone] (phone)

## 2018-02-02 NOTE — ED Notes (Signed)
ED Provider at bedside. EDP YAO

## 2018-02-02 NOTE — ED Notes (Signed)
Patient transported to X-ray 

## 2018-02-02 NOTE — ED Notes (Signed)
ATTEMPTED BLOOD WORK. NOT SUCCESSFFUL. REQUESTED JONNA NT TO ATTEMPT

## 2018-02-02 NOTE — ED Triage Notes (Signed)
Per [facility]- Pt standing on chair and lost balance landing on left knee. Prior to EMS arrival long leg splint applied. Questionable deformity to left knee. No LOC, Denies neck and back pain. Good CMS. No other trauma noted

## 2018-02-02 NOTE — ED Provider Notes (Signed)
[facility] DEPT Provider Note   CSN: [id number] Arrival date & time: 02/02/18  1314     History   Chief Complaint Chief Complaint  Patient presents with  . Fall  . Knee Pain    left    HPI Samantha Hogan is a 84 y.o. female with a history of hypertension, hyperlipidemia, and hypothyroidism who presents to the emergency department status post fall just PTA complaining of left knee and left lower leg pain.  Patient states she was standing on a kitchen chair in order to reach something, she became off balance resulting in her to fall off the chair.  States she landed onto her left foot fairly hard.  She states that she then slowly fell to the ground onto what she believes was onto her bottom.  No head injury.  No loss of consciousness.  Patient was not dizzy/short of breath/lightheaded/having chest pain prior to fall were present.  She states that she was not able to get up on her own due to pain in the left knee.  Rates her current pain a 3 out of 10 in severity, states that she received some type of medication from EMS, she is unsure what this medication was.  Pain is worse with attempted movement.  Denies numbness, tingling, weakness, neck pain, back pain, pelvic pain, or any other areas of discomfort at this time.  Patient is not on any blood thinners.  HPI  History reviewed. No pertinent past medical history.  Patient Active Problem List   Diagnosis Date Noted  . HAIR LOSS 03/13/2008  . HEMATURIA UNSPECIFIED 09/26/2007  . POSTHERPETIC NEURALGIA 03/15/2007  . 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 HYPOTHYROIDISM 03/14/2007  . HYPERLIPIDEMIA 03/14/2007  . ASTHMA 03/14/2007  . GERD 03/14/2007  . MURMUR 03/14/2007  . ALLERGY 03/14/2007  . HYPERCHOLESTEROLEMIA 02/14/2007    History reviewed. No pertinent surgical history.   OB History   None      Home Medications    Prior to Admission medications   Medication Sig Start Date End Date  Taking? Authorizing Provider  amLODipine-atorvastatin (CADUET) 5-40 MG tablet Take 1 tablet by mouth daily.   Yes [provider]  celecoxib (CELEBREX) 200 MG capsule Take 200 mg by mouth daily as needed for pain. 11/03/17  Yes [provider]  Cholecalciferol (VITAMIN D-3 PO) Take 1 tablet by mouth at bedtime.    Yes [provider]  Cyanocobalamin (VITAMIN B 12 PO) Place 1 each under the tongue at bedtime.   Yes [provider]  gabapentin (NEURONTIN) 600 MG tablet Take 600 mg by mouth daily as needed (pain.).   Yes [provider]  Garlic (GARLIQUE PO) Take 1 tablet by mouth 2 (two) times a week.    Yes [provider]  levothyroxine (SYNTHROID, LEVOTHROID) 25 MCG tablet Take 25 mcg by mouth daily before breakfast.   Yes [provider]  Multiple Vitamins-Minerals (CENTRUM SILVER 50+WOMEN PO) Take 1 tablet by mouth at bedtime.   Yes [provider]  metoprolol tartrate (LOPRESSOR) 25 MG tablet Take 25 mg by mouth 2 (two) times daily.    [provider]    Family History History reviewed. No pertinent family history.  Social History Social History   Tobacco Use  . Smoking status: Never Smoker  Substance Use Topics  . Alcohol use: Not on file  . Drug use: Not on file     Allergies   Amoxicillin; Atorvastatin; Conjugated estrogens; Fenofibrate;  Pravastatin sodium; Propoxyphene hcl; Rosuvastatin; and Penicillins   Review of Systems Review of Systems  Constitutional: Negative for chills and fever.  Respiratory: Negative for shortness of breath.   Cardiovascular: Negative for chest pain and palpitations.  Gastrointestinal: Negative for abdominal pain, nausea and vomiting.  Musculoskeletal: Positive for arthralgias (Left knee pain) and joint swelling (left knee). Negative for back pain and neck pain.  Neurological: Negative for dizziness, syncope, weakness, light-headedness and numbness.       Negative  for paresthesias.   All other systems reviewed and are negative.    Physical Exam Updated Vital Signs BP (!) 143/78 (BP Location: Left Arm)   Pulse 87   Temp 98 F (36.7 C) (Oral)   Resp 16   Ht 5' 2.5" (1.588 m)   Wt 61.2 kg (135 lb)   SpO2 98%   BMI 24.30 kg/m   Physical Exam  Constitutional: She appears well-developed and well-nourished.  Non-toxic appearance. No distress.  HENT:  Head: Normocephalic and atraumatic. Head is without raccoon's eyes and without Battle's sign.  Right Ear: No hemotympanum.  Left Ear: No hemotympanum.  Nose: Nose normal.  Mouth/Throat: Uvula is midline and oropharynx is clear and moist.  Eyes: Pupils are equal, round, and reactive to light. Conjunctivae and EOM are normal. Right eye exhibits no discharge. Left eye exhibits no discharge.  Neck: No spinous process tenderness and no muscular tenderness present.  Cardiovascular: Normal rate and regular rhythm.  No murmur heard. Pulses:      Radial pulses are 2+ on the right side, and 2+ on the left side.       Dorsalis pedis pulses are 2+ on the right side, and 2+ on the left side.  Pulmonary/Chest: Breath sounds normal. No respiratory distress. She has no wheezes. She has no rhonchi. She has no rales.  Abdominal: Soft. She exhibits no distension. There is no tenderness.  Musculoskeletal:  Upper extremities: Normal range of motion.  Nontender. Back: No midline tenderness. Lower Extremities: Patient appears to have some swelling to the left knee as well as the proximal lower leg. She has limited ROM at the left knee secondary to pain/swelling. She has full ROM of the L ankle. L hip ROM difficult to assess secondary to pain in the knee. LLE: She is tender to palpation over the proximal tibia as well as the fibular head. There is some tenderness at the medial/lateral joint lines of the L knee. RLE non tender. No pelvic/hip or foot/ankle tenderness to palpation bilaterally.   Neurological:  Alert.  Clear  speech.  CN III through XII grossly intact.  Patient has 5 out of 5 symmetric grip strength.  She has 5 out of 5 strength with plantar dorsiflexion bilaterally.  Sensation grossly intact bilateral upper and lower extremities.  Skin: Skin is warm and dry. No abrasion, no bruising, no ecchymosis, no laceration and no rash noted.  Psychiatric: She has a normal mood and affect. Her behavior is normal.  Nursing note and vitals reviewed.    ED Treatments / Results  Labs Results for orders placed or performed during the hospital encounter of 02/02/18  CBC with Differential  Result Value Ref Range   WBC 14.4 (H) 4.0 - 10.5 K/uL   RBC 4.70 3.87 - 5.11 MIL/uL   Hemoglobin 12.4 12.0 - 15.0 g/dL   HCT 38.0 36.0 - 46.0 %   MCV 80.9 78.0 - 100.0 fL   MCH 26.4 26.0 - 34.0 pg   MCHC 32.6 30.0 -  36.0 g/dL   RDW 13.2 11.5 - 15.5 %   Platelets 308 150 - 400 K/uL   Neutrophils Relative % 80 %   Neutro Abs 11.4 (H) 1.7 - 7.7 K/uL   Lymphocytes Relative 12 %   Lymphs Abs 1.8 0.7 - 4.0 K/uL   Monocytes Relative 8 %   Monocytes Absolute 1.1 (H) 0.1 - 1.0 K/uL   Eosinophils Relative 0 %   Eosinophils Absolute 0.0 0.0 - 0.7 K/uL   Basophils Relative 0 %   Basophils Absolute 0.0 0.0 - 0.1 K/uL  Basic metabolic panel  Result Value Ref Range   Sodium 134 (L) 135 - 145 mmol/L   Potassium 4.0 3.5 - 5.1 mmol/L   Chloride 99 (L) 101 - 111 mmol/L   CO2 22 22 - 32 mmol/L   Glucose, Bld 103 (H) 65 - 99 mg/dL   BUN 23 (H) 6 - 20 mg/dL   Creatinine, Ser 0.96 0.44 - 1.00 mg/dL   Calcium 9.0 8.9 - 10.3 mg/dL   GFR calc non Af Amer 53 (L) >60 mL/min   GFR calc Af Amer >60 >60 mL/min   Anion gap 13 5 - 15  Type and screen [place]  Result Value Ref Range   ABO/RH(D) PENDING    Antibody Screen PENDING    Sample Expiration      02/05/2018 Performed at [facility], [place] [address], [place], [place] [address]    None  Radiology Dg Knee 1-2 Views Left  Result Date:  02/02/2018 CLINICAL DATA:  Recent fall with knee deformity, initial encounter EXAM: LEFT KNEE - 1-2 VIEW COMPARISON:  None. FINDINGS: Comminuted fracture of the proximal tibia is noted with impaction at the fracture site. Fracture lines extend into the articular surface laterally. Fat fluid effusion is noted. Proximal fibular fracture is noted as well. IMPRESSION: Proximal tibial and fibular fractures. Electronically Signed   By: Inez Catalina M.D.   On: 02/02/2018 15:30   Dg Tibia/fibula Left  Result Date: 02/02/2018 CLINICAL DATA:  84 year old female status post fall with left knee pain and deformity EXAM: LEFT TIBIA AND FIBULA - 2 VIEW COMPARISON:  Concurrently obtained radiographs of the left knee FINDINGS: Acute comminuted tibial plateau fracture and acute fracture of the fibular neck. No evidence of additional fracture in the mid or distal lower leg. The ankle joint appears intact. No focal soft tissue abnormality. IMPRESSION: Acute comminuted tibial plateau fracture. Acute impacted fracture of the fibular neck. Electronically Signed   By: Jacqulynn Cadet M.D.   On: 02/02/2018 15:28    Procedures Procedures (including critical care time)  Medications Ordered in ED Medications - No data to display   Initial Impression / Assessment and Plan / ED Course  I have reviewed the triage vital signs and the nursing notes.  Pertinent labs & imaging results that were available during my care of the patient were reviewed by me and considered in my medical decision making (see chart for details).   Patient presents status post what appears to have been a mechanical fall complaining of left knee pain and left proximal lower leg pain.  Patient is nontoxic-appearing, in no apparent distress, vitals WNL with the exception of elevated blood pressure, do not suspect hypertensive emergency at this time.  Exam notable for left knee and proximal tip/fib swelling and tenderness to palpation.  X-rays obtained with  acute comminuted tibial plateau fracture and acute impacted fracture of the fibular neck.  Patient is  neurovascularly intact distally.  Discussed patient's X-ray results with her and family at bedside. Will plan for orthopedic surgery consult. Findings and plan of care discussed with supervising physician Dr. Darl Householder who personally evaluated and examined this patient and is in agreement.  16:10; CONSULT: Discussed case with orthopedic surgeon Dr. Doreatha Martin- requesting medical admission with knee immobilizer application and CT scan of the knee. Will likely not be able to operate until Sunday/early this upcoming week.   Immobilizer and CT scan ordered.   16:20:CONSULT: Discussed case with hospitalist Dr. Wendee Beavers, declining medical admission at this time given this is not a hip fracture. States he will call and clarify with his administrator and call back. --> Subsequent call received by Dr. Hortencia Pilar shortly after initial discussion. He has discussed with his administrator and is declining medical admission. Recommends orthopedics admission with hospitalist consultation. He requested further questions be directed to his administrator regarding this.   16:39: CONSULT: Discussed with Dr. Doreatha Martin regarding conversation with Dr. Wendee Beavers. He will discuss with hospitalist team and call back.    16:55:CONSULT: Spoke with Dr. Doreatha Martin, will admit the patient, requesting [facility] to [facility].   Pre-op labs were obtained- notable for nonspecific leukocytosis at 14.4. Mild electrolyte abnormalities including hyponatremia at 134 and chloride at 99.   [facility] placing hold/transfer orders at change of shift.   Final Clinical Impressions(s) / ED Diagnoses   Final diagnoses:  Closed fracture of left tibial plateau, initial encounter  Closed fracture of neck of left fibula, initial encounter    ED Discharge Orders    None       Amaryllis Dyke, PA-C 02/02/18 1719    Drenda Freeze, MD 02/03/18 [phone]

## 2018-02-03 LAB — CBC
HEMATOCRIT: 31.7 % — AB (ref 36.0–46.0)
HEMOGLOBIN: 10.4 g/dL — AB (ref 12.0–15.0)
MCH: 26.5 pg (ref 26.0–34.0)
MCHC: 32.8 g/dL (ref 30.0–36.0)
MCV: 80.7 fL (ref 78.0–100.0)
Platelets: 283 10*3/uL (ref 150–400)
RBC: 3.93 MIL/uL (ref 3.87–5.11)
RDW: 13.1 % (ref 11.5–15.5)
WBC: 6.6 10*3/uL (ref 4.0–10.5)

## 2018-02-03 LAB — TYPE AND SCREEN
ABO/RH(D): B NEG
Antibody Screen: NEGATIVE

## 2018-02-03 LAB — CREATININE, SERUM
Creatinine, Ser: 1.03 mg/dL — ABNORMAL HIGH (ref 0.44–1.00)
GFR, EST AFRICAN AMERICAN: 56 mL/min — AB (ref >60)
GFR, EST NON AFRICAN AMERICAN: 49 mL/min — AB (ref >60)

## 2018-02-03 LAB — SURGICAL PCR SCREEN
MRSA, PCR: NEGATIVE
STAPHYLOCOCCUS AUREUS: NEGATIVE

## 2018-02-03 LAB — ABO/RH: ABO/RH(D): B NEG

## 2018-02-03 MED ORDER — VITAMIN B-12 100 MCG PO TABS
100.0000 ug | ORAL_TABLET | Freq: Every day | ORAL | Status: DC
  Administered 2018-02-05: 100 ug via ORAL
  Filled 2018-02-03 (×4): qty 1

## 2018-02-03 MED ORDER — ATORVASTATIN CALCIUM 40 MG PO TABS
40.0000 mg | ORAL_TABLET | Freq: Every day | ORAL | Status: DC
  Filled 2018-02-03 (×3): qty 1

## 2018-02-03 MED ORDER — POVIDONE-IODINE 10 % EX SWAB: 2.0000 "application " | Freq: Once | CUTANEOUS | Status: DC

## 2018-02-03 MED ORDER — MUPIROCIN 2 % EX OINT
1.0000 "application " | TOPICAL_OINTMENT | Freq: Two times a day (BID) | CUTANEOUS | Status: AC
  Administered 2018-02-03 – 2018-02-05 (×4): 1 via NASAL
  Filled 2018-02-03: qty 22

## 2018-02-03 MED ORDER — AMLODIPINE BESYLATE 5 MG PO TABS
5.0000 mg | ORAL_TABLET | Freq: Every day | ORAL | Status: DC
  Administered 2018-02-03 – 2018-02-06 (×3): 5 mg via ORAL
  Filled 2018-02-03 (×4): qty 1

## 2018-02-03 MED ORDER — CHLORHEXIDINE GLUCONATE 4 % EX LIQD: 60.0000 mL | Freq: Once | CUTANEOUS | Status: DC

## 2018-02-03 MED ORDER — CEFAZOLIN SODIUM-DEXTROSE 2-4 GM/100ML-% IV SOLN
2.0000 g | INTRAVENOUS | Status: AC
  Administered 2018-02-04: 2 g via INTRAVENOUS
  Filled 2018-02-03: qty 100

## 2018-02-03 MED ORDER — ACETAMINOPHEN 325 MG PO TABS: 650.0000 mg | ORAL_TABLET | Freq: Four times a day (QID) | ORAL | Status: DC | PRN

## 2018-02-03 MED ORDER — SODIUM CHLORIDE 0.9 % IV SOLN
INTRAVENOUS | Status: DC
  Administered 2018-02-03 – 2018-02-05 (×3): via INTRAVENOUS

## 2018-02-03 NOTE — ED Notes (Signed)
IV has infiltrated. Requesting IV Ultrasound.

## 2018-02-03 NOTE — Progress Notes (Signed)
Pt supplied copy of living will, protected health information and power of attorney, Placed in pt chart. Needs to be scanned in

## 2018-02-03 NOTE — ED Notes (Signed)
Alden Feagan (son): [phone] cell [phone] home Would like to be made aware if patient is moved

## 2018-02-03 NOTE — ED Notes (Signed)
[facility] has been called and will be in route ASAP, However [facility] dispatch sated there will be a wait.

## 2018-02-03 NOTE — ED Notes (Signed)
[facility] has arrived to pick up and transport pt to 5N room 2 at [place].

## 2018-02-04 ENCOUNTER — Inpatient Hospital Stay (HOSPITAL_COMMUNITY): Payer: Medicare Other | Admitting: Certified Registered"

## 2018-02-04 ENCOUNTER — Encounter (HOSPITAL_COMMUNITY)
Admission: EM | Disposition: A | Discharge status: Skilled Nursing Facility | Payer: Self-pay | Source: Home / Self Care | Attending: Student

## 2018-02-04 ENCOUNTER — Inpatient Hospital Stay (HOSPITAL_COMMUNITY): Payer: Medicare Other

## 2018-02-04 ENCOUNTER — Other Ambulatory Visit: Payer: Self-pay

## 2018-02-04 HISTORY — PX: ORIF TIBIA PLATEAU: SHX2132

## 2018-02-04 SURGERY — OPEN REDUCTION INTERNAL FIXATION (ORIF) TIBIAL PLATEAU
Anesthesia: General | Site: Leg Upper | Laterality: Left

## 2018-02-04 MED ORDER — FENTANYL CITRATE (PF) 250 MCG/5ML IJ SOLN
INTRAMUSCULAR | Status: DC | PRN
  Administered 2018-02-04 (×5): 50 ug via INTRAVENOUS

## 2018-02-04 MED ORDER — FENTANYL CITRATE (PF) 250 MCG/5ML IJ SOLN
INTRAMUSCULAR | Status: AC
  Filled 2018-02-04: qty 5

## 2018-02-04 MED ORDER — ACETAMINOPHEN 10 MG/ML IV SOLN
INTRAVENOUS | Status: DC | PRN
  Administered 2018-02-04: 1000 mg via INTRAVENOUS

## 2018-02-04 MED ORDER — ACETAMINOPHEN 10 MG/ML IV SOLN
INTRAVENOUS | Status: AC
  Filled 2018-02-04: qty 100

## 2018-02-04 MED ORDER — DEXAMETHASONE SODIUM PHOSPHATE 10 MG/ML IJ SOLN
INTRAMUSCULAR | Status: DC | PRN
  Administered 2018-02-04: 10 mg via INTRAVENOUS

## 2018-02-04 MED ORDER — SUCCINYLCHOLINE CHLORIDE 200 MG/10ML IV SOSY
PREFILLED_SYRINGE | INTRAVENOUS | Status: AC
  Filled 2018-02-04: qty 10

## 2018-02-04 MED ORDER — VANCOMYCIN HCL 1000 MG IV SOLR
INTRAVENOUS | Status: AC
  Filled 2018-02-04: qty 1000

## 2018-02-04 MED ORDER — LIDOCAINE 2% (20 MG/ML) 5 ML SYRINGE
INTRAMUSCULAR | Status: AC
  Filled 2018-02-04: qty 5

## 2018-02-04 MED ORDER — LIDOCAINE 2% (20 MG/ML) 5 ML SYRINGE
INTRAMUSCULAR | Status: DC | PRN
  Administered 2018-02-04: 60 mg via INTRAVENOUS

## 2018-02-04 MED ORDER — ROCURONIUM BROMIDE 10 MG/ML (PF) SYRINGE
PREFILLED_SYRINGE | INTRAVENOUS | Status: AC
  Filled 2018-02-04: qty 5

## 2018-02-04 MED ORDER — DEXAMETHASONE SODIUM PHOSPHATE 10 MG/ML IJ SOLN
INTRAMUSCULAR | Status: AC
  Filled 2018-02-04: qty 1

## 2018-02-04 MED ORDER — CEFAZOLIN SODIUM-DEXTROSE 2-4 GM/100ML-% IV SOLN
2.0000 g | Freq: Three times a day (TID) | INTRAVENOUS | Status: AC
  Administered 2018-02-04 – 2018-02-05 (×3): 2 g via INTRAVENOUS
  Filled 2018-02-04 (×3): qty 100

## 2018-02-04 MED ORDER — FENTANYL CITRATE (PF) 100 MCG/2ML IJ SOLN: 25.0000 ug | INTRAMUSCULAR | Status: DC | PRN

## 2018-02-04 MED ORDER — ONDANSETRON HCL 4 MG/2ML IJ SOLN
INTRAMUSCULAR | Status: DC | PRN
  Administered 2018-02-04: 4 mg via INTRAVENOUS

## 2018-02-04 MED ORDER — 0.9 % SODIUM CHLORIDE (POUR BTL) OPTIME
TOPICAL | Status: DC | PRN
  Administered 2018-02-04: 1000 mL

## 2018-02-04 MED ORDER — SUGAMMADEX SODIUM 200 MG/2ML IV SOLN
INTRAVENOUS | Status: DC | PRN
  Administered 2018-02-04: 150 mg via INTRAVENOUS

## 2018-02-04 MED ORDER — LACTATED RINGERS IV SOLN
INTRAVENOUS | Status: DC | PRN
  Administered 2018-02-04 (×2): via INTRAVENOUS

## 2018-02-04 MED ORDER — ROCURONIUM BROMIDE 10 MG/ML (PF) SYRINGE
PREFILLED_SYRINGE | INTRAVENOUS | Status: DC | PRN
  Administered 2018-02-04: 40 mg via INTRAVENOUS

## 2018-02-04 MED ORDER — PROPOFOL 10 MG/ML IV BOLUS
INTRAVENOUS | Status: AC
  Filled 2018-02-04: qty 20

## 2018-02-04 MED ORDER — VANCOMYCIN HCL 1000 MG IV SOLR
INTRAVENOUS | Status: DC | PRN
  Administered 2018-02-04: 1000 mg via TOPICAL

## 2018-02-04 MED ORDER — ONDANSETRON HCL 4 MG/2ML IJ SOLN
INTRAMUSCULAR | Status: AC
  Filled 2018-02-04: qty 2

## 2018-02-04 MED ORDER — PROPOFOL 10 MG/ML IV BOLUS
INTRAVENOUS | Status: DC | PRN
  Administered 2018-02-04: 100 mg via INTRAVENOUS

## 2018-02-04 SURGICAL SUPPLY — 83 items
BANDAGE ACE 4X5 VEL STRL LF (GAUZE/BANDAGES/DRESSINGS) ×3 IMPLANT
BANDAGE ACE 6X5 VEL STRL LF (GAUZE/BANDAGES/DRESSINGS) ×3 IMPLANT
BANDAGE ELASTIC 6 VELCRO ST LF (GAUZE/BANDAGES/DRESSINGS) ×2 IMPLANT
BANDAGE ESMARK 6X9 LF (GAUZE/BANDAGES/DRESSINGS) ×1 IMPLANT
BIT DRILL 2.5 X LONG (BIT) ×1
BIT DRILL CALIBR QC 2.8X250 (BIT) ×2 IMPLANT
BIT DRILL PERC QC 2.8X200 100 (BIT) IMPLANT
BIT DRILL X LONG 2.5 (BIT) IMPLANT
BLADE CLIPPER SURG (BLADE) IMPLANT
BLADE SURG 15 STRL LF DISP TIS (BLADE) ×1 IMPLANT
BLADE SURG 15 STRL SS (BLADE) ×3
BNDG CMPR 9X6 STRL LF SNTH (GAUZE/BANDAGES/DRESSINGS) ×1
BNDG ESMARK 6X9 LF (GAUZE/BANDAGES/DRESSINGS) ×3
BNDG GAUZE ELAST 4 BULKY (GAUZE/BANDAGES/DRESSINGS) ×3 IMPLANT
BRUSH SCRUB SURG 4.25 DISP (MISCELLANEOUS) ×6 IMPLANT
CANISTER PREVENA PLUS 150 (CANNISTER) ×3 IMPLANT
CANISTER SUCT 3000ML PPV (MISCELLANEOUS) ×3 IMPLANT
CAST PADDING SYN 3 (CAST SUPPLIES) ×2 IMPLANT
CHLORAPREP W/TINT 26ML (MISCELLANEOUS) ×6 IMPLANT
COVER SURGICAL LIGHT HANDLE (MISCELLANEOUS) ×3 IMPLANT
CUFF TOURNIQUET SINGLE 34IN LL (TOURNIQUET CUFF) ×3 IMPLANT
DRAPE C-ARM 42X72 X-RAY (DRAPES) ×3 IMPLANT
DRAPE C-ARMOR (DRAPES) ×3 IMPLANT
DRAPE ORTHO SPLIT 77X108 STRL (DRAPES) ×6
DRAPE SURG ORHT 6 SPLT 77X108 (DRAPES) ×2 IMPLANT
DRAPE U-SHAPE 47X51 STRL (DRAPES) ×3 IMPLANT
DRILL BIT QUICK COUP 2.8MM 100 (BIT) ×2
DRILL BIT X LONG 2.5 (BIT) ×3
DRSG MEPILEX BORDER 4X4 (GAUZE/BANDAGES/DRESSINGS) ×4 IMPLANT
DRSG PAD ABDOMINAL 8X10 ST (GAUZE/BANDAGES/DRESSINGS) ×6 IMPLANT
ELECT REM PT RETURN 9FT ADLT (ELECTROSURGICAL) ×3
ELECTRODE REM PT RTRN 9FT ADLT (ELECTROSURGICAL) ×1 IMPLANT
GAUZE SPONGE 4X4 12PLY STRL (GAUZE/BANDAGES/DRESSINGS) ×3 IMPLANT
GAUZE SPONGE 4X4 16PLY XRAY LF (GAUZE/BANDAGES/DRESSINGS) ×3 IMPLANT
GLOVE BIO SURGEON STRL SZ7.5 (GLOVE) ×12 IMPLANT
GLOVE BIOGEL PI IND STRL 7.5 (GLOVE) ×1 IMPLANT
GLOVE BIOGEL PI INDICATOR 7.5 (GLOVE) ×2
GOWN STRL REUS W/ TWL LRG LVL3 (GOWN DISPOSABLE) ×2 IMPLANT
GOWN STRL REUS W/TWL LRG LVL3 (GOWN DISPOSABLE) ×6
IMMOBILIZER KNEE 22 UNIV (SOFTGOODS) ×3 IMPLANT
KIT BASIN OR (CUSTOM PROCEDURE TRAY) ×3 IMPLANT
KIT PREVENA INCISION MGT 13 (CANNISTER) ×2 IMPLANT
KIT TURNOVER KIT B (KITS) ×3 IMPLANT
NDL SUT 6 .5 CRC .975X.05 MAYO (NEEDLE) ×1 IMPLANT
NEEDLE MAYO TAPER (NEEDLE) ×3
NS IRRIG 1000ML POUR BTL (IV SOLUTION) ×3 IMPLANT
PACK TOTAL JOINT (CUSTOM PROCEDURE TRAY) ×3 IMPLANT
PAD ARMBOARD 7.5X6 YLW CONV (MISCELLANEOUS) ×6 IMPLANT
PAD CAST 4YDX4 CTTN HI CHSV (CAST SUPPLIES) ×1 IMPLANT
PADDING CAST COTTON 4X4 STRL (CAST SUPPLIES) ×3
PADDING CAST COTTON 6X4 STRL (CAST SUPPLIES) ×3 IMPLANT
PADDING CAST SYNTHETIC 4 (CAST SUPPLIES) ×2
PADDING CAST SYNTHETIC 4X4 STR (CAST SUPPLIES) IMPLANT
PLATE PROX TIBIA 6H LT 3.5 VA (Plate) ×3 IMPLANT
SCREW CORTEX 3.5 30MM (Screw) ×2 IMPLANT
SCREW CORTEX 3.5 34MM (Screw) ×2 IMPLANT
SCREW CORTEX 3.5 40MM (Screw) ×2 IMPLANT
SCREW LOCK CORT ST 3.5X30 (Screw) IMPLANT
SCREW LOCK CORT ST 3.5X34 (Screw) IMPLANT
SCREW LOCK CORT ST 3.5X40 (Screw) IMPLANT
SCREW LOCK ST STARDR 3.5X42 (Screw) ×2 IMPLANT
SCREW LOCKING 3.5X70MM VA (Screw) ×2 IMPLANT
SCREW LOCKING 3.5X80MM VA (Screw) ×2 IMPLANT
SCREW LOCKING VA 3.5X75MM (Screw) ×9 IMPLANT
STAPLER VISISTAT 35W (STAPLE) ×3 IMPLANT
SUCTION FRAZIER HANDLE 10FR (MISCELLANEOUS) ×2
SUCTION TUBE FRAZIER 10FR DISP (MISCELLANEOUS) ×1 IMPLANT
SUT ETHILON 2 0 FS 18 (SUTURE) ×3 IMPLANT
SUT ETHILON 3 0 PS 1 (SUTURE) ×4 IMPLANT
SUT FIBERWIRE #2 38 T-5 BLUE (SUTURE)
SUT VIC AB 0 CT1 27 (SUTURE)
SUT VIC AB 0 CT1 27XBRD ANBCTR (SUTURE) IMPLANT
SUT VIC AB 1 CT1 18XCR BRD 8 (SUTURE) IMPLANT
SUT VIC AB 1 CT1 27 (SUTURE) ×6
SUT VIC AB 1 CT1 27XBRD ANBCTR (SUTURE) ×1 IMPLANT
SUT VIC AB 1 CT1 8-18 (SUTURE)
SUT VIC AB 2-0 CT1 18 (SUTURE) ×3 IMPLANT
SUT VIC AB 2-0 CT1 27 (SUTURE) ×6
SUT VIC AB 2-0 CT1 TAPERPNT 27 (SUTURE) ×2 IMPLANT
SUTURE FIBERWR #2 38 T-5 BLUE (SUTURE) IMPLANT
TOWEL OR 17X26 10 PK STRL BLUE (TOWEL DISPOSABLE) ×6 IMPLANT
TRAY FOLEY W/METER SILVER 16FR (SET/KITS/TRAYS/PACK) IMPLANT
WATER STERILE IRR 1000ML POUR (IV SOLUTION) ×6 IMPLANT

## 2018-02-04 NOTE — Op Note (Signed)
OrthopaedicSurgeryOperativeNote [phone]) Date of Surgery: 02/04/2018  Admit Date: 02/02/2018   Diagnoses: Pre-Op Diagnoses: Left bicondylar tibial plateau fracture  Post-Op Diagnosis: Same  Procedures: CPT 27536-Open reduction internal fixation of bicondylar tibial plateau fracture  Surgeons: Primary: Shona Needles, MD   Location:[facility] OR ROOM 06   AnesthesiaGeneral   Antibiotics:Ancef 2g preop   Tourniquettime: Total Tourniquet Time Documented: Thigh (Left) - 95 minutes Total: Thigh (Left) - 95 minutes   [id number]:37 mL   Complications:None  Specimens:None  Implants: Implant Name Type Inv. Item Serial No. Manufacturer Lot No. LRB No. Used Action  PLATE PROX TIBIA 6H LT 3.5 VA - [id number] Plate PLATE PROX TIBIA 6H LT 3.5 VA [id number] [employer] [id number] Left 1 Implanted  SCREW LOCKING VA 3.5X75MM - [id number] Screw SCREW LOCKING VA 3.5X75MM [id number] [employer] [id number] Left 3 Implanted  SCREW LOCKING 3.5X80MM VA - [id number] Screw SCREW LOCKING 3.5X80MM VA [id number] [employer] [id number] Left 1 Implanted  SCREW CORTEX 3.5 34MM - [id number] Screw SCREW CORTEX 3.5 34MM [id number] [employer] [id number] Left 1 Implanted  SCREW CORTEX 3.5 30MM - [id number] Screw SCREW CORTEX 3.5 30MM [id number] [employer] [id number] Left 1 Implanted  SCREW LOCK ST STARDR 3.5X42 - [id number] Screw SCREW LOCK ST STARDR 3.5X42 [id number] [employer] [id number] Left 1 Implanted    IndicationsforSurgery: 84 year old female who fell from a chair and sustained a displaced bicolored tibial plateau fracture.  Due to her independent ambulatory status and the displacement of the fracture I felt that open reduction internal fixation was most appropriate.  I discussed the risks and benefits with the patient and her son. Risks discussed included bleeding requiring blood transfusion, bleeding causing a hematoma, infection, malunion, nonunion, damage to surrounding nerves and blood  vessels, pain, hardware prominence or irritation, hardware failure, stiffness, post-traumatic arthritis, DVT/PE, compartment syndrome, and even death.  The patient agreed to proceed with surgery.  Operative Findings: 1.  Bicondylar tibial plateau fracture treated with open reduction internal fixation using a [employer]s 3.5 mm LCP variable angle proximal tibial locking plate 2.  Intact lateral meniscus 3.  Severe osteoporosis.  Procedure: The patient was identified in the preoperative holding area. Consent was confirmed with the patient and their family and all questions were answered. The operative extremity was marked after confirmation with the patient. she was then brought back to the operating room by our anesthesia colleagues.  She was carefully transferred over to a radiolucent flat top table.  Here she was placed under general anesthetic.  A Foley catheter was placed.  A nonsterile tourniquet was placed in the upper thigh.  A bump was placed under her left hip. The operative extremity was then prepped and draped in usual sterile fashion. A preoperative timeout was performed to verify the patient, the procedure, and the extremity. Preoperative antibiotics were dosed.  Fluoroscopy was used evaluate the injury and were saved. An esmarch was used to exsanguinate the leg and it was inflated to 367mmHg. An anterolateral parapatellar incision was performed and carried through skin and subcutaneous tissue. The overlying fascia was incised in line with the skin incision just lateral to the patellar tendon. It was extended distally into the anterior compartment fascia. Bovie electrocautery was then used to elevated the IT band and musculature off of the anterolateral cortex of the tibia. The release was taken back until the fibular head was palpable. The plane between the IT band and the lateral capsule was developed and the anterior fat pad was resected to expose the capsule.  A submensical arthrotomy was  then performed with a 15 blade. Tag sutures were used to retract the capsule and here I was able to visualize the  lateral joint and was able to see the meniscus.  There was no tear of the lateral meniscus.  There was a split in the anterolateral cortex that was entered into with a Cobb elevator. The clot was debrided and a reduction maneuver was performed on the lateral joint.  Unfortunately her bone had severe osteoporosis it was difficult time manipulating the fragment and reducing it to the medial plateau with the use of the clamp.  This combined with the fact that there was impaction of metaphysis made it difficult to judge the reduction of the articular surface.  Every time I placed the knee in varus to visualize the reduction of the anterior cortex displaced as well.  As a result I was able to eventually use manipulation of the fragment to reduce his anatomic as possible and held it provisionally with K wires.  I did reduce the medial side using fluoroscopy and placed 2.0 mm K wires going from the medial metaphysis into the lateral tibial shaft to provisionally hold the metaphyseal fragment out of varus.  A 3.34mm LCP proximal tibial locking plate was chosen and was aligned to the tibia in both the AP and lateral fluoroscopic views. A peri-articular reduction clamp was then used to compress the plate to the lateral plateau. A non-locking buttress screw was placed in the proximal segment to bring the plate flush with bone. Locking screws were then drilled and placed crossing both the lateral and medial plateau, thereby. A total of 4 locking screws were placed in the proximal segment.  Nonlocking screws were then placed percutaneously into the shaft of the plate down the bone.  The first buttress screw was removed and it was exchanged with a locking screw to provide a slightly more rigid construct.  I was concerned about healing of the fracture and I did not want to create a too rigid of the construct and  as result I did not proceed to place any more locking screws in the metaphyseal region.  The tourniquet was dropped and hemostasis was obtained. The incision was thoroughly irrigated. The tag sutures for the capsule were brought through the plate and tied down and the meniscus repair sutures were brought through the IT band. One gram of vancomycin powder was placed in the wound and the IT band and anterior tibialis fascia was closed with 0-vicryl suture. The skin was closed with 2-0 vicryl and 3-0 nylon. The percutaneous incisions were closed with 3-0 nylon suture. A Preveena wound vac dressing was placed as the patient did have a relatively tight closure. The remainder of the incisions were dressed with mepilex. The knee and leg were dressed with sterile cast padding and an ACE wrap and was placed back in a knee immobilizer. The patient was then awoken from anesthesia and taken to PACU in stable condition.  Post Op Plan/Instructions: Patient will be nonweightbearing to the left lower extremity.  She will be transition to a hinged knee brace locked in extension.  She received postoperative Ancef.  We will continue Lovenox for DVT prophylaxis.  I will change her dressing on postoperative day 2.  I was present and performed the entire surgery.  Katha Hamming, MD [facility]

## 2018-02-04 NOTE — Progress Notes (Signed)
Orthopedic Tech Progress Note Patient Details:  Samantha Hogan [birth year] [id number]  Patient ID: Samantha Hogan, female   DOB: [birth year], 84 y.o.   MRN: [id number]   [name] 02/04/2018, 3:39 PM Called [employer] brace order; spoke with answering service

## 2018-02-04 NOTE — Anesthesia Procedure Notes (Signed)
Procedure Name: Intubation Date/Time: 02/04/2018 7:46 AM Performed by: Clearnce Sorrel, CRNA Pre-anesthesia Checklist: Patient identified, Emergency Drugs available, Suction available, Patient being monitored and Timeout performed Patient Re-evaluated:Patient Re-evaluated prior to induction Oxygen Delivery Method: Circle system utilized Preoxygenation: Pre-oxygenation with 100% oxygen Induction Type: IV induction Ventilation: Mask ventilation without difficulty and Oral airway inserted - appropriate to patient size Laryngoscope Size: Mac and 3 Grade View: Grade II Tube type: Oral Tube size: 7.0 mm Number of attempts: 1 Airway Equipment and Method: Stylet Placement Confirmation: ETT inserted through vocal cords under direct vision,  positive ETCO2 and breath sounds checked- equal and bilateral Secured at: 22 cm Tube secured with: Tape Dental Injury: Teeth and Oropharynx as per pre-operative assessment

## 2018-02-04 NOTE — Progress Notes (Signed)
Xrays in progress

## 2018-02-04 NOTE — Interval H&P Note (Signed)
History and Physical Interval Note:  02/04/2018 7:21 AM  Samantha Hogan  has presented today for surgery, with the diagnosis of Left tibial plateau fracture  The various methods of treatment have been discussed with the patient and family. After consideration of risks, benefits and other options for treatment, the patient has consented to  Procedure(s): OPEN REDUCTION INTERNAL FIXATION (ORIF) TIBIAL PLATEAU (Left) as a surgical intervention .  The patient's history has been reviewed, patient examined, no change in status, stable for surgery.  I have reviewed the patient's chart and labs.  Questions were answered to the patient's satisfaction.     Lennette Bihari P Bret Stamour

## 2018-02-04 NOTE — Transfer of Care (Signed)
Immediate Anesthesia Transfer of Care Note  Patient: Samantha Hogan  Procedure(s) Performed: OPEN REDUCTION INTERNAL FIXATION (ORIF) TIBIAL PLATEAU (Left Leg Upper)  Patient Location: PACU  Anesthesia Type:General  Level of Consciousness: awake, alert  and oriented  Airway & Oxygen Therapy: Patient Spontanous Breathing  Post-op Assessment: Report given to RN and Post -op Vital signs reviewed and stable  Post vital signs: Reviewed and stable  Last Vitals:  Vitals Value Taken Time  BP 160/72 02/04/2018 10:23 AM  Temp    Pulse 85 02/04/2018 10:26 AM  Resp 15 02/04/2018 10:26 AM  SpO2 99 % 02/04/2018 10:26 AM  Vitals shown include unvalidated device data.  Last Pain:  Vitals:   02/04/18 0542  TempSrc: Oral  PainSc:          Complications: No apparent anesthesia complications

## 2018-02-04 NOTE — Anesthesia Postprocedure Evaluation (Signed)
Anesthesia Post Note  Patient: Samantha Hogan  Procedure(s) Performed: OPEN REDUCTION INTERNAL FIXATION (ORIF) TIBIAL PLATEAU (Left Leg Upper)     Patient location during evaluation: PACU Anesthesia Type: General Level of consciousness: awake and alert Pain management: pain level controlled Vital Signs Assessment: post-procedure vital signs reviewed and stable Respiratory status: spontaneous breathing, nonlabored ventilation and respiratory function stable Cardiovascular status: blood pressure returned to baseline and stable Postop Assessment: no apparent nausea or vomiting Anesthetic complications: no    Last Vitals:  Vitals:   02/04/18 1053 02/04/18 1100  BP: (!) 165/76   Pulse: 90 93  Resp: 13 14  Temp:  (!) 36.3 C  SpO2: 95% 94%    Last Pain:  Vitals:   02/04/18 1050  TempSrc:   PainSc: 0-No pain                 Taleigh Gero,W. EDMOND

## 2018-02-04 NOTE — Progress Notes (Signed)
Spoke with pt's son and informed of her being picked up for surgery around 06:00 am this morning. Pt's son Jenny Reichmann) stated understanding.

## 2018-02-04 NOTE — Anesthesia Preprocedure Evaluation (Addendum)
Anesthesia Evaluation  Patient identified by MRN, date of birth, ID band Patient awake    Reviewed: Allergy & Precautions, H&P , NPO status , Patient's Chart, lab work & pertinent test results, reviewed documented beta blocker date and time   Airway Mallampati: II  TM Distance: >3 FB Neck ROM: Full    Dental no notable dental hx. (+) Teeth Intact, Dental Advisory Given   Pulmonary asthma ,    Pulmonary exam normal breath sounds clear to auscultation- rhonchi       Cardiovascular hypertension, Pt. on medications and Pt. on home beta blockers  Rhythm:Regular Rate:Normal     Neuro/Psych negative neurological ROS  negative psych ROS   GI/Hepatic negative GI ROS, Neg liver ROS,   Endo/Other  Hypothyroidism   Renal/GU negative Renal ROS  negative genitourinary   Musculoskeletal   Abdominal   Peds  Hematology negative hematology ROS (+)   Anesthesia Other Findings   Reproductive/Obstetrics negative OB ROS                           Anesthesia Physical Anesthesia Plan  ASA: II  Anesthesia Plan: General   Post-op Pain Management:    Induction: Intravenous  PONV Risk Score and Plan: 4 or greater and Ondansetron, Dexamethasone and Treatment may vary due to age or medical condition  Airway Management Planned: Oral ETT  Additional Equipment:   Intra-op Plan:   Post-operative Plan: Extubation in OR  Informed Consent: I have reviewed the patients History and Physical, chart, labs and discussed the procedure including the risks, benefits and alternatives for the proposed anesthesia with the patient or authorized representative who has indicated his/her understanding and acceptance.   Dental advisory given  Plan Discussed with: CRNA  Anesthesia Plan Comments:         Anesthesia Quick Evaluation

## 2018-02-05 ENCOUNTER — Encounter
Admission: RE | Admit: 2018-02-05 | Discharge: 2018-02-05 | Disposition: A | Discharge status: Home / Self Care | Payer: Medicare Other | Source: Ambulatory Visit | Attending: Internal Medicine | Admitting: Internal Medicine

## 2018-02-05 ENCOUNTER — Encounter (HOSPITAL_COMMUNITY): Payer: Self-pay | Admitting: Student

## 2018-02-05 DIAGNOSIS — Z0189 Encounter for other specified special examinations: Secondary | ICD-10-CM | POA: Insufficient documentation

## 2018-02-05 LAB — CBC
HCT: 26.3 % — ABNORMAL LOW (ref 36.0–46.0)
HEMOGLOBIN: 8.7 g/dL — AB (ref 12.0–15.0)
MCH: 26.4 pg (ref 26.0–34.0)
MCHC: 33.1 g/dL (ref 30.0–36.0)
MCV: 79.7 fL (ref 78.0–100.0)
PLATELETS: 271 10*3/uL (ref 150–400)
RBC: 3.3 MIL/uL — AB (ref 3.87–5.11)
RDW: 13.2 % (ref 11.5–15.5)
WBC: 9.4 10*3/uL (ref 4.0–10.5)

## 2018-02-05 NOTE — Evaluation (Signed)
Physical Therapy Evaluation Patient Details Name: Samantha Hogan MRN: [id number] DOB: 04/17/1932 Today's Date: 02/05/2018   History of Present Illness  84 yo female s/p fall with ORIF Tibial plateau L LE with wound vac  Clinical Impression  Pt admitted with above diagnosis. Pt currently with functional limitations due to the deficits listed below (see PT Problem List). At the time of PT eval pt was able to perform transfers and ambulation with up to +2 assist for balance support and safety. Pt very independent PTA and is planning on short term rehab stay prior to return home alone. Anticipate pt will progress well with post-op mobility. Acutely, pt will benefit from skilled PT to increase their independence and safety with mobility to allow discharge to the venue listed below.       Follow Up Recommendations SNF;Supervision/Assistance - 24 hour    Equipment Recommendations  Rolling walker with 5" wheels;3in1 (PT);Wheelchair (measurements PT);Wheelchair cushion (measurements PT);Hospital bed    Recommendations for Other Services       Precautions / Restrictions Precautions Precautions: Fall Precaution Comments: wound VAC/ foley Required Braces or Orthoses: Other Brace/Splint Other Brace/Splint: hinge brace L LE locked in extension Restrictions Weight Bearing Restrictions: Yes LLE Weight Bearing: Non weight bearing      Mobility  Bed Mobility Overal bed mobility: Needs Assistance Bed Mobility: Supine to Sit     Supine to sit: Mod assist     General bed mobility comments: pt requires v/c to use R LE to bridge buttock and scoot to EOB. pt able to use bed rail bil UE to pull UB toward EOB. Pt with (A) to move L LE off EOB and support until positioned in full upright position  Transfers Overall transfer level: Needs assistance Equipment used: Rolling walker (2 wheeled) Transfers: Sit to/from Stand Sit to Stand: +2 safety/equipment;Min assist         General transfer comment:  pt requires cues for hand placement and initially to power up from surface. pt able to static stand in RW with NWB LLE  Ambulation/Gait Ambulation/Gait assistance: Mod assist; +2 safety/equipment management Ambulation Distance (Feet): 5 Feet Assistive device: Rolling walker (2 wheeled) Gait Pattern/deviations: Step-to pattern;Decreased stride length;Trunk flexed Gait velocity: Decreased Gait velocity interpretation: <1.31 ft/sec, indicative of household ambulator General Gait Details: Pt able to demonstrate a hop-to gait pattern to maintain NWB. Assist required for walker management (especially during turns) and balance support throughout gait training.   Stairs            Wheelchair Mobility    Modified Rankin (Stroke Patients Only)       Balance Overall balance assessment: Needs assistance Sitting-balance support: Feet supported;No upper extremity supported Sitting balance-Leahy Scale: Fair     Standing balance support: During functional activity;Bilateral upper extremity supported Standing balance-Leahy Scale: Poor Standing balance comment: Reliant on walker due to NWB status                             Pertinent Vitals/Pain Pain Assessment: Faces Faces Pain Scale: Hurts little more Pain Location: L LE Pain Descriptors / Indicators: Operative site guarding Pain Intervention(s): Monitored during session;Repositioned    Home Living Family/patient expects to be discharged to:: Private residence Living Arrangements: Alone Available Help at Discharge: Family;Available PRN/intermittently Type of Home: House Home Access: Stairs to enter Entrance Stairs-Rails: None Entrance Stairs-Number of Steps: 2 in the garage, 4 in the front Home Layout: One level Home Equipment:  Shower seat Additional Comments: son can (A) upon d/c. lives alone and does all daily cleaning. pt does not do yardwork     Prior Function Level of Independence: Independent                Hand Dominance   Dominant Hand: Right    Extremity/Trunk Assessment   Upper Extremity Assessment Upper Extremity Assessment: Defer to OT evaluation    Lower Extremity Assessment Lower Extremity Assessment: LLE deficits/detail LLE Deficits / Details: Acute pain, decreased strength and AROM consistent with above mentioned procedure.     Cervical / Trunk Assessment Cervical / Trunk Assessment: Normal(Forward head posture with rounded shoulders in standing)  Communication   Communication: No difficulties  Cognition Arousal/Alertness: Awake/alert Behavior During Therapy: WFL for tasks assessed/performed Overall Cognitive Status: Within Functional Limits for tasks assessed                                        General Comments General comments (skin integrity, edema, etc.): wound vac with hinge brace LLE    Exercises General Exercises - Lower Extremity Ankle Circles/Pumps: 20 reps Quad Sets: 10 reps Gluteal Sets: 10 reps   Assessment/Plan    PT Assessment Patient needs continued PT services  PT Problem List Decreased strength;Decreased range of motion;Decreased activity tolerance;Decreased balance;Decreased mobility;Decreased knowledge of use of DME;Decreased safety awareness;Decreased knowledge of precautions;Pain       PT Treatment Interventions DME instruction;Gait training;Stair training;Functional mobility training;Therapeutic activities;Therapeutic exercise;Neuromuscular re-education;Patient/family education    PT Goals (Current goals can be found in the Care Plan section)  Acute Rehab PT Goals Patient Stated Goal: to return home after rehab PT Goal Formulation: With patient Time For Goal Achievement: 02/12/18 Potential to Achieve Goals: Good    Frequency Min 3X/week   Barriers to discharge Decreased caregiver support      Co-evaluation PT/OT/SLP Co-Evaluation/Treatment: Yes Reason for Co-Treatment: To address functional/ADL  transfers;For patient/therapist safety PT goals addressed during session: Mobility/safety with mobility;Balance;Proper use of DME;Strengthening/ROM         AM-PAC PT "6 Clicks" Daily Activity  Outcome Measure Difficulty turning over in bed (including adjusting bedclothes, sheets and blankets)?: Unable Difficulty moving from lying on back to sitting on the side of the bed? : Unable Difficulty sitting down on and standing up from a chair with arms (e.g., wheelchair, bedside commode, etc,.)?: Unable Help needed moving to and from a bed to chair (including a wheelchair)?: A Little Help needed walking in hospital room?: A Little Help needed climbing 3-5 steps with a railing? : Total 6 Click Score: 10    End of Session Equipment Utilized During Treatment: Gait belt;Other (comment)(Wound VAC, L hinged knee brace) Activity Tolerance: Patient tolerated treatment well Patient left: in chair;with call bell/phone within reach;with chair alarm set Nurse Communication: Mobility status PT Visit Diagnosis: Unsteadiness on feet (R26.81);Pain;History of falling (Z91.81);Other abnormalities of gait and mobility (R26.89) Pain - Right/Left: Left Pain - part of body: Leg    Time: 2426-8341 PT Time Calculation (min) (ACUTE ONLY): 35 min   Charges:   PT Evaluation $PT Eval Moderate Complexity: 1 Mod     PT G Codes:        Rolinda Roan, PT, DPT Acute Rehabilitation Services Pager: [phone]   Thelma Comp 02/05/2018, 9:58 AM

## 2018-02-05 NOTE — Social Work (Signed)
CSW met with patient at bedside for SNF placement. CSW provided patient with list of offers and she will discuss with son.  CSW will f/u for disposition.  Elissa Hefty, LCSW Clinical Social Worker [phone]

## 2018-02-05 NOTE — Evaluation (Signed)
Occupational Therapy Evaluation Patient Details Name: Samantha Hogan MRN: [id number] DOB: 1931/11/03 Today's Date: 02/05/2018    History of Present Illness 84 yo female s/p fall with ORIF Tibial plateau L LE with wound vac  Past Medical History:  Diagnosis Date  . Asthma   . Cancer ([place])   . Carcinoma ([place])    basil cell      Clinical Impression   Patient is s/p ORIF L LE tibial plateau surgery resulting in functional limitations due to the deficits listed below (see OT problem list). PTA was independent with all adls. Pt currently requires min (A) with RW to complete basic transfer stand pivot and mod (A) for LB adls. Pt requires two person (A) for bathroom level transfers to address next session.  Patient will benefit from skilled OT acutely to increase independence and safety with ADLS to allow discharge SNF.     Follow Up Recommendations  SNF    Equipment Recommendations  3 in 1 bedside commode;Wheelchair (measurements OT);Wheelchair cushion (measurements OT);Hospital bed(RW)    Recommendations for Other Services       Precautions / Restrictions Precautions Precautions: Fall Precaution Comments: wound vac/ foley Required Braces or Orthoses: Other Brace/Splint Other Brace/Splint: hinge brace L LE Restrictions Weight Bearing Restrictions: Yes LLE Weight Bearing: Non weight bearing      Mobility Bed Mobility Overal bed mobility: Needs Assistance Bed Mobility: Supine to Sit     Supine to sit: Mod assist     General bed mobility comments: pt requires v/c to use R LE to bridge buttock and scoot to EOB. pt able to use bed rail bil UE to pull UB toward EOB. Pt with (A) to move L LE off EOB and support until positioned in full upright position  Transfers Overall transfer level: Needs assistance Equipment used: Rolling walker (2 wheeled) Transfers: Sit to/from Stand Sit to Stand: +2 safety/equipment;Min assist         General transfer comment: pt requires cues  for hand placement and initially to power up from surface. pt able to static stand in RW with NWB LLE    Balance                                           ADL either performed or assessed with clinical judgement   ADL Overall ADL's : Needs assistance/impaired Eating/Feeding: Independent   Grooming: Wash/dry hands;Wash/dry face;Brushing hair;Independent;Sitting Grooming Details (indicate cue type and reason): requires seated position Upper Body Bathing: Supervision/ safety;Sitting   Lower Body Bathing: Moderate assistance   Upper Body Dressing : Supervision/safety   Lower Body Dressing: Moderate assistance Lower Body Dressing Details (indicate cue type and reason): pt educated to dress L LE first and pt is able to bridge buttock from bed surface and pull up mesh panties. pt was unable to thread panties at this time Toilet Transfer: Minimal assistance;RW;BSC             General ADL Comments: pt is able to move with RW with hopping into the RW     Vision Baseline Vision/History: No visual deficits       Perception     Praxis      Pertinent Vitals/Pain Pain Assessment: Faces Faces Pain Scale: Hurts little more Pain Location: L LE Pain Descriptors / Indicators: Operative site guarding Pain Intervention(s): Monitored during session;Premedicated before session;Repositioned;Ice applied  Hand Dominance Right   Extremity/Trunk Assessment Upper Extremity Assessment Upper Extremity Assessment: Overall WFL for tasks assessed   Lower Extremity Assessment Lower Extremity Assessment: Overall WFL for tasks assessed   Cervical / Trunk Assessment Cervical / Trunk Assessment: Normal   Communication Communication Communication: No difficulties   Cognition Arousal/Alertness: Awake/alert Behavior During Therapy: WFL for tasks assessed/performed Overall Cognitive Status: Within Functional Limits for tasks assessed                                      General Comments  wound vac with hinge brace LLE    Exercises     Shoulder Instructions      Home Living Family/patient expects to be discharged to:: Private residence Living Arrangements: Alone Available Help at Discharge: Family;Available PRN/intermittently Type of Home: House Home Access: Stairs to enter [employer] of Steps: 2 in the garage, 4 in the front Entrance Stairs-Rails: None Home Layout: One level     Bathroom Shower/Tub: Tub/shower unit;Walk-in shower   Bathroom Toilet: Standard     Home Equipment: Shower seat   Additional Comments: son can (A) upon d/c. lives alone and does all daily cleaning. pt does not do yardwork       Prior Functioning/Environment Level of Independence: Independent                 OT Problem List: Decreased strength;Decreased activity tolerance;Decreased safety awareness;Decreased knowledge of precautions;Decreased knowledge of use of DME or AE;Impaired balance (sitting and/or standing);Pain;Cardiopulmonary status limiting activity      OT Treatment/Interventions: Self-care/ADL training;Therapeutic exercise;DME and/or AE instruction;Energy conservation;Therapeutic activities;Patient/family education;Balance training    OT Goals(Current goals can be found in the care plan section) Acute Rehab OT Goals Patient Stated Goal: to return home after rehab OT Goal Formulation: With patient Time For Goal Achievement: 02/19/18 Potential to Achieve Goals: Good  OT Frequency: Min 2X/week   Barriers to D/C: Decreased caregiver support  lives alone       Co-evaluation              AM-PAC PT "6 Clicks" Daily Activity     Outcome Measure Help from another person eating meals?: None Help from another person taking care of personal grooming?: None Help from another person toileting, which includes using toliet, bedpan, or urinal?: A Little Help from another person bathing (including washing, rinsing,  drying)?: A Little Help from another person to put on and taking off regular upper body clothing?: A Little Help from another person to put on and taking off regular lower body clothing?: A Lot 6 Click Score: 19   End of Session Equipment Utilized During Treatment: Gait belt;Rolling walker Nurse Communication: Mobility status;Precautions  Activity Tolerance: Patient tolerated treatment well Patient left: in chair;with call bell/phone within reach;with chair alarm set  OT Visit Diagnosis: Unsteadiness on feet (R26.81)                Time: 9833-8250 OT Time Calculation (min): 28 min Charges:  OT General Charges $OT Visit: 1 Visit OT Evaluation $OT Eval Moderate Complexity: 1 Mod G-Codes:      Jeri Modena   OTR/L Pager: [phone] Office: [phone] .   Parke Poisson B 02/05/2018, 9:20 AM

## 2018-02-05 NOTE — Clinical Social Work Note (Signed)
Clinical Social Work Assessment  Patient Details  Name: Samantha Hogan MRN: [id number] Date of Birth: November 08, 1931  Date of referral:  02/05/18               Reason for consult:  Facility Placement                Permission sought to share information with:  Chartered certified accountant granted to share information::  Yes, Verbal Permission Granted  Name::     Alvera Novel  Agency::  SNF  Relationship::  son  Contact Information:     Housing/Transportation Living arrangements for the past 2 months:  [place] of Information:  Patient Patient Interpreter Needed:  None Criminal Activity/Legal Involvement Pertinent to Current Situation/Hospitalization:  No - Comment as needed Significant Relationships:  Adult Children, [place], Industrial/product designer Lives with:  Self Do you feel safe going back to the place where you live?  No Need for family participation in patient care:  No (Coment)  Care giving concerns:  Pt resides home alone and will need skilled nursing before returning home.    Social Worker assessment / plan:  CSW met with patient at bedside to discuss the SNF process/placement. Pt has no experience with SNF. CSW answered all questions. CSW obtained permission to send to SNF's in [place] and [facility]. Pt was independent with ADL's and ambulation prior to hospitalization. CSW will f/u on SNF offers and provide to patient once they are received.   CSW will f/u for disposition.  Employment status:  Retired Forensic scientist:  Medicare PT Recommendations:  [place] / Referral to community resources:  [place]  Patient/Family's Response to care:  Patient thanked CSW for meeting to discuss placement. Pt agreeable to SNF at discharge.  Patient/Family's Understanding of and Emotional Response to Diagnosis, Current Treatment, and Prognosis:  Pt has good understanding to need for therapy and appears to be in  good emotional state about the process of rehabilitative need. Pt desires to return home to her independence once she has completed therapy. Pt indicated that she would be interested in [place] and [facility] for placement. No issues or concerns identified.  Emotional Assessment Appearance:  Appears stated age Attitude/Demeanor/Rapport:  (Cooperative) Affect (typically observed):  Accepting, Appropriate Orientation:  Oriented to Situation, Oriented to  Time, Oriented to Place, Oriented to Self Alcohol / Substance use:  Not Applicable Psych involvement (Current and /or in the community):  No (Comment)  Discharge Needs  Concerns to be addressed:  Discharge Planning Concerns Readmission within the last 30 days:  No Current discharge risk:  Dependent with Mobility, Lives alone, Physical Impairment Barriers to Discharge:  No Barriers Identified   [name], LCSW 02/05/2018, 1:03 PM

## 2018-02-05 NOTE — Progress Notes (Signed)
Orthopaedic Trauma Progress Note  S: Doing well, pain controlled. No issues overnight  O:  Vitals:   02/04/18 2330 02/05/18 0703  BP: 140/70 140/71  Pulse: 96 87  Resp: 17 15  Temp: 97.9 F (36.6 C) 98 F (36.7 C)  SpO2: 95% 96%   Gen: NAD, AAOx3 LLE: Dressing clean, dry and intact. Neurovascularly intact. Incisional vac with no output and good seal. Hinged knee brace on  Imaging: stable post op images  Labs:  CBC    Component Value Date/Time   WBC 9.4 02/05/2018 0639   RBC 3.30 (L) 02/05/2018 0639   HGB 8.7 (L) 02/05/2018 0639   HCT 26.3 (L) 02/05/2018 0639   PLT 271 02/05/2018 0639   MCV 79.7 02/05/2018 0639   MCH 26.4 02/05/2018 0639   MCHC 33.1 02/05/2018 0639   RDW 13.2 02/05/2018 0639   LYMPHSABS 1.8 02/02/2018 1513   MONOABS 1.1 (H) 02/02/2018 1513   EOSABS 0.0 02/02/2018 1513   BASOSABS 0.0 02/02/2018 15169    A/P: 84 year old s/p ORIF of bicondylar tibial plateau fracture  Weightbearing: NWB LLE Insicional and dressing care: Dressing change tomorrow Orthopedic device(s): hinged knee brace locked in extension Showering: Not yet VTE prophylaxis: Lovenox 40mg  qd 30 days Pain control: Tramadol Follow - up plan: 2 weeks  Shona Needles, MD [facility] [phone] (phone)

## 2018-02-05 NOTE — [place] FL2 (Signed)
MEDICAID FL2 LEVEL OF CARE SCREENING TOOL     IDENTIFICATION  Patient Name: Samantha Hogan Birthdate: 09-08-1932 Sex: female Admission Date (Current Location): 02/02/2018  [facility] and [place] Number:  Herbalist and Address:  [facility], [place] [address], [place], [place] [address]      Provider Number: [id number]  Attending Physician Name and Address:  Shona Needles, MD  Relative Name and Phone Number:  Timarie Labell, son, [phone]    Current Level of Care: Hospital Recommended Level of Care: [place] Prior Approval Number:    Date Approved/Denied:   PASRR Number: [id number]  Discharge Plan: SNF    Current Diagnoses: Patient Active Problem List   Diagnosis Date Noted  . Closed bicondylar fracture of left tibial plateau 02/02/2018  . Closed bicondylar fracture of right tibial plateau 02/02/2018  . HAIR LOSS 03/13/2008  . HEMATURIA UNSPECIFIED 09/26/2007  . POSTHERPETIC NEURALGIA 03/15/2007  . 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 HYPOTHYROIDISM 03/14/2007  . HYPERLIPIDEMIA 03/14/2007  . ASTHMA 03/14/2007  . GERD 03/14/2007  . MURMUR 03/14/2007  . ALLERGY 03/14/2007  . HYPERCHOLESTEROLEMIA 02/14/2007    Orientation RESPIRATION BLADDER Height & Weight     Self, Time, Situation, Place  Normal Continent Weight: 135 lb (61.2 kg) Height:  5' 2.5" (158.8 cm)  BEHAVIORAL SYMPTOMS/MOOD NEUROLOGICAL BOWEL NUTRITION STATUS      Continent Diet(See DC Summary)  AMBULATORY STATUS COMMUNICATION OF NEEDS Skin   Extensive Assist Verbally Surgical wounds                       Personal Care Assistance Level of Assistance  Dressing, Feeding, Bathing Bathing Assistance: Maximum assistance Feeding assistance: Limited assistance Dressing Assistance: Maximum assistance     Functional Limitations Info  Sight, Hearing, Speech Sight Info: Adequate Hearing Info: Adequate Speech Info: Adequate     SPECIAL CARE FACTORS FREQUENCY  PT (By licensed PT), OT (By licensed OT)     PT Frequency: 3x week OT Frequency: 3x week            Contractures Contractures Info: Not present    Additional Factors Info  Code Status, Allergies Code Status Info: Full code Allergies Info: AMOXICILLIN, ATORVASTATIN, CONJUGATED ESTROGENS, FENOFIBRATE, PRAVASTATIN SODIUM, PROPOXYPHENE HCL, ROSUVASTATIN, PENICILLINS            Current Medications (02/05/2018):  This is the current hospital active medication list Current Facility-Administered Medications  Medication Dose Route Frequency Provider Last Rate Last Dose  . 0.9 %  sodium chloride infusion   Intravenous Continuous Haddix, Thomasene Lot, MD 125 mL/hr at 02/05/18 [phone]    . acetaminophen (TYLENOL) tablet 650 mg  650 mg Oral Q6H PRN Haddix, Thomasene Lot, MD      . amLODipine (NORVASC) tablet 5 mg  5 mg Oral Daily Haddix, Thomasene Lot, MD   5 mg at 02/05/18 0801   And  . atorvastatin (LIPITOR) tablet 40 mg  40 mg Oral Daily Haddix, Thomasene Lot, MD      . celecoxib (CELEBREX) capsule 200 mg  200 mg Oral Daily PRN Haddix, Thomasene Lot, MD      . docusate sodium (COLACE) capsule 100 mg  100 mg Oral BID Haddix, Thomasene Lot, MD   100 mg at 02/05/18 0801  . enoxaparin (LOVENOX) injection 40 mg  40 mg Subcutaneous Q24H Haddix, Thomasene Lot, MD   40 mg at 02/04/18 2139  . gabapentin (NEURONTIN) capsule 600 mg  600 mg Oral Daily PRN Haddix, Thomasene Lot, MD      . HYDROcodone-acetaminophen (NORCO/VICODIN) 5-325 MG per tablet 1-2 tablet  1-2 tablet Oral Q6H PRN Haddix, Thomasene Lot, MD   1 tablet at 02/02/18 2254  . levothyroxine (SYNTHROID, LEVOTHROID) tablet 25 mcg  25 mcg Oral QAC breakfast Haddix, Thomasene Lot, MD   25 mcg at 02/05/18 0801  . multivitamin with minerals tablet   Oral QHS Haddix, Thomasene Lot, MD   Stopped at 02/03/18 0222  . mupirocin ointment (BACTROBAN) 2 % 1 application  1 application Nasal BID Haddix, Thomasene Lot, MD   1 application at 24/26/83 0806  . ondansetron (ZOFRAN) injection 4  mg  4 mg Intravenous Q6H PRN Haddix, Thomasene Lot, MD      . traMADol Veatrice Bourbon) tablet 50 mg  50 mg Oral Q6H PRN Haddix, Thomasene Lot, MD   50 mg at 02/05/18 0801  . vitamin B-12 (CYANOCOBALAMIN) tablet 100 mcg  100 mcg Oral QHS Haddix, Thomasene Lot, MD         Discharge Medications: Please see discharge summary for a list of discharge medications.  Relevant Imaging Results:  Relevant Lab Results:   Additional Information SS#: [id number]  [name], LCSW

## 2018-02-06 MED ORDER — ENOXAPARIN SODIUM 40 MG/0.4ML ~~LOC~~ SOLN: 40.0000 mg | SUBCUTANEOUS | Status: AC

## 2018-02-06 MED ORDER — TRAMADOL HCL 50 MG PO TABS: 50.0000 mg | ORAL_TABLET | Freq: Four times a day (QID) | ORAL | 0 refills | Status: DC | PRN

## 2018-02-06 MED ORDER — HYDROCODONE-ACETAMINOPHEN 5-325 MG PO TABS: 1.0000 | ORAL_TABLET | Freq: Four times a day (QID) | ORAL | 0 refills | Status: DC | PRN

## 2018-02-06 MED ORDER — DOCUSATE SODIUM 100 MG PO CAPS: 100.0000 mg | ORAL_CAPSULE | Freq: Two times a day (BID) | ORAL | 0 refills | Status: AC

## 2018-02-06 MED ORDER — ACETAMINOPHEN 325 MG PO TABS: 650.0000 mg | ORAL_TABLET | Freq: Four times a day (QID) | ORAL | Status: AC | PRN

## 2018-02-06 NOTE — Progress Notes (Signed)
Rn called [facility] [facility] and gave report to [place], pt comfortable awaiting [facility] who is scheduled to pick her upp at 3pm

## 2018-02-06 NOTE — Social Work (Signed)
Clinical Social Worker facilitated patient discharge including contacting patient family and facility to confirm patient discharge plans.  Clinical information faxed to facility and family agreeable with plan.    CSW arranged ambulance transport via [facility] to [facility] [facility].    RN to call [phone] to give report prior to discharge. Pt going to Room 208A.  Clinical Social Worker will sign off for now as social work intervention is no longer needed. Please consult [place] again if new need arises.  Elissa Hefty, LCSW Clinical Social Worker [phone]

## 2018-02-06 NOTE — Discharge Summary (Signed)
Physician Discharge Summary  Patient ID: Samantha Hogan MRN: [id number] DOB/AGE: 1932/05/04 84 y.o.  Admit date: 02/02/2018 Discharge date: 02/06/2018  Discharge Diagnoses Patient Active Problem List   Diagnosis Date Noted  . Closed bicondylar fracture of left tibial plateau 02/02/2018  . Closed bicondylar fracture of right tibial plateau 02/02/2018  . HAIR LOSS 03/13/2008  . HEMATURIA UNSPECIFIED 09/26/2007  . POSTHERPETIC NEURALGIA 03/15/2007  . 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 HYPOTHYROIDISM 03/14/2007  . HYPERLIPIDEMIA 03/14/2007  . ASTHMA 03/14/2007  . GERD 03/14/2007  . MURMUR 03/14/2007  . ALLERGY 03/14/2007  . HYPERCHOLESTEROLEMIA 02/14/2007    Consultants None  Procedures ORIF left tibia plateau  HPI: Samantha Hogan is an 84 y.o. female who is being seen in consultation at the request of Dr. Corlis Leak for evaluation of left tibial plateau fracture.  This 84 year old female who is otherwise healthy who does not use an ambulatory device.  She was on her chair changing checking her air du when shect lost her balance and fell off and landed on her left leg.  She had immediate pain and deformity and inability to bear weight.  She was brought into the hospital where x-rays showed a displaced bicondylar tibial plateau fracture was consulted for evaluation.  She has no significant medical history.  She was seen with her son at bedside in the [facility] emergency room.  Denies any other injury.  Denies any numbness or tingling.    Hospital Course: She underwent repair of her tibia fracture by Dr. Doreatha Martin on 4/28. She was mobilized with PT/OT who recommended SNF placement. Her pain was controlled on oral medications. Once a facility was found she was discharged there in good condition.    Allergies as of 02/06/2018      Reactions   Amoxicillin    REACTION: rash Has patient had a PCN reaction causing immediate rash, facial/tongue/throat swelling, SOB or lightheadedness with  hypotension: No Has patient had a PCN reaction causing severe rash involving mucus membranes or skin necrosis: Yes Has patient had a PCN reaction that required hospitalization: No Has patient had a PCN reaction occurring within the last 10 years-No If all of the above answers are "NO", then may proceed with Cephalosporin use.   Atorvastatin    REACTION: muscle pain   Conjugated Estrogens    Fenofibrate    REACTION: muscle pain   Pravastatin Sodium    REACTION: insomnia   Propoxyphene Hcl    REACTION: nausea   Rosuvastatin    REACTION: muscle pain   Penicillins Rash      Medication List    STOP taking these medications   celecoxib 200 MG capsule Commonly known as:  CELEBREX     TAKE these medications   acetaminophen 325 MG tablet Commonly known as:  TYLENOL Take 2 tablets (650 mg total) by mouth every 6 (six) hours as needed for mild pain (use first).   amLODipine-atorvastatin 5-40 MG tablet Commonly known as:  CADUET Take 1 tablet by mouth daily.   CENTRUM SILVER 50+WOMEN PO Take 1 tablet by mouth at bedtime.   docusate sodium 100 MG capsule Commonly known as:  COLACE Take 1 capsule (100 mg total) by mouth 2 (two) times daily.   enoxaparin 40 MG/0.4ML injection Commonly known as:  LOVENOX Inject 0.4 mLs (40 mg total) into the skin daily.   gabapentin 600 MG tablet Commonly known as:  NEURONTIN Take 600 mg by mouth daily as needed (pain.).   GARLIQUE PO  Take 1 tablet by mouth 2 (two) times a week.   HYDROcodone-acetaminophen 5-325 MG tablet Commonly known as:  NORCO/VICODIN Take 1-2 tablets by mouth every 6 (six) hours as needed for moderate pain.   levothyroxine 25 MCG tablet Commonly known as:  SYNTHROID, LEVOTHROID Take 25 mcg by mouth daily before breakfast.   metoprolol tartrate 25 MG tablet Commonly known as:  LOPRESSOR Take 25 mg by mouth 2 (two) times daily.   traMADol 50 MG tablet Commonly known as:  ULTRAM Take 1 tablet (50 mg total) by  mouth every 6 (six) hours as needed for severe pain.   VITAMIN B 12 PO Place 1 each under the tongue at bedtime.   VITAMIN D-3 PO Take 1 tablet by mouth at bedtime.        Follow-up Information    Haddix, Thomasene Lot, MD. Schedule an appointment as soon as possible for a visit in 2 week(s).   Specialty:  Orthopedic Surgery Contact information: [address] [place] [address] [phone]           Signed: Lisette Abu, PA-C Orthopedic Surgery [phone]  02/06/2018, 1:33 PM

## 2018-02-06 NOTE — Social Work (Signed)
CSW met with patient at friend at bedside to discuss SNF offers. Pt accepted SNF offer from [facility] CSW confirmed SNF with admissions.   CSW will f/u for disposition as patient has a bed at [facility].  Elissa Hefty, LCSW Clinical Social Worker [phone]

## 2018-02-06 NOTE — Discharge Instructions (Signed)
No bearing weight on left leg.  Keep dressing on.  Keep leg brace on and locked in extension.

## 2018-02-06 NOTE — Clinical Social Work Placement (Signed)
   CLINICAL SOCIAL WORK PLACEMENT  NOTE  Date:  02/06/2018  Patient Details  Name: Samantha Hogan MRN: [id number] Date of Birth: 09/19/1932  Clinical Social Work is seeking post-discharge placement for this patient at the [place] level of care (*CSW will initial, date and re-position this form in  chart as items are completed):  Yes   Patient/family provided with [facility] Work Department's list of facilities offering this level of care within the geographic area requested by the patient (or if unable, by the patient's family).  Yes   Patient/family informed of their freedom to choose among providers that offer the needed level of care, that participate in Medicare, Medicaid or managed care program needed by the patient, have an available bed and are willing to accept the patient.  Yes   Patient/family informed of [facility]'s ownership interest in [facility] and [facility], as well as of the fact that they are under no obligation to receive care at these facilities.  PASRR submitted to [employer] on       PASRR number received on 02/05/18     Existing PASRR number confirmed on       FL2 transmitted to all facilities in geographic area requested by pt/family on 02/05/18     FL2 transmitted to all facilities within larger geographic area on       Patient informed that his/her managed care company has contracts with or will negotiate with certain facilities, including the following:        Yes   Patient/family informed of bed offers received.  Patient chooses bed at [facility]     Physician recommends and patient chooses bed at      Patient to be transferred to [facility] on 02/06/18.  Patient to be transferred to facility by [facility]     Patient family notified on 02/06/18 of transfer.  Name of family member notified:  pt responsible for self     PHYSICIAN       Additional Comment:     _______________________________________________ [name], LCSW 02/06/2018, 1:43 PM

## 2018-02-06 NOTE — Care Management Important Message (Signed)
Important Message  Patient Details  Name: Samantha Hogan MRN: [id number] Date of Birth: [birth year]   Medicare Important Message Given:  Yes    Veleda Mun [address] 02/06/2018, 12:50 PM

## 2018-02-07 ENCOUNTER — Other Ambulatory Visit
Admission: RE | Admit: 2018-02-07 | Discharge: 2018-02-07 | Disposition: A | Discharge status: Home / Self Care | Payer: Medicare Other | Source: Ambulatory Visit | Attending: Gerontology | Admitting: Gerontology

## 2018-02-07 ENCOUNTER — Encounter
Admission: RE | Admit: 2018-02-07 | Discharge: 2018-02-07 | Disposition: A | Discharge status: Home / Self Care | Payer: Medicare Other | Source: Ambulatory Visit | Attending: Internal Medicine | Admitting: Internal Medicine

## 2018-02-07 ENCOUNTER — Other Ambulatory Visit: Payer: Self-pay

## 2018-02-07 DIAGNOSIS — S82143A Displaced bicondylar fracture of unspecified tibia, initial encounter for closed fracture: Secondary | ICD-10-CM | POA: Diagnosis not present

## 2018-02-07 DIAGNOSIS — X58XXXA Exposure to other specified factors, initial encounter: Secondary | ICD-10-CM | POA: Insufficient documentation

## 2018-02-07 DIAGNOSIS — D649 Anemia, unspecified: Secondary | ICD-10-CM | POA: Insufficient documentation

## 2018-02-07 LAB — CBC WITH DIFFERENTIAL/PLATELET
BASOS ABS: 0 10*3/uL (ref 0–0.1)
Basophils Relative: 1 %
EOS PCT: 2 %
Eosinophils Absolute: 0.1 10*3/uL (ref 0–0.7)
HCT: 29.5 % — ABNORMAL LOW (ref 35.0–47.0)
Hemoglobin: 10.1 g/dL — ABNORMAL LOW (ref 12.0–16.0)
Lymphocytes Relative: 19 %
Lymphs Abs: 1.5 10*3/uL (ref 1.0–3.6)
MCH: 27.3 pg (ref 26.0–34.0)
MCHC: 34.1 g/dL (ref 32.0–36.0)
MCV: 79.9 fL — ABNORMAL LOW (ref 80.0–100.0)
Monocytes Absolute: 0.7 10*3/uL (ref 0.2–0.9)
Monocytes Relative: 9 %
Neutro Abs: 5.4 10*3/uL (ref 1.4–6.5)
Neutrophils Relative %: 69 %
PLATELETS: 345 10*3/uL (ref 150–440)
RBC: 3.69 MIL/uL — AB (ref 3.80–5.20)
RDW: 13.5 % (ref 11.5–14.5)
WBC: 7.7 10*3/uL (ref 3.6–11.0)

## 2018-02-07 LAB — COMPREHENSIVE METABOLIC PANEL
ALBUMIN: 3.2 g/dL — AB (ref 3.5–5.0)
ALT: 18 U/L (ref 14–54)
AST: 34 U/L (ref 15–41)
Alkaline Phosphatase: 57 U/L (ref 38–126)
Anion gap: 8 (ref 5–15)
BUN: 13 mg/dL (ref 6–20)
CHLORIDE: 96 mmol/L — AB (ref 101–111)
CO2: 28 mmol/L (ref 22–32)
CREATININE: 0.75 mg/dL (ref 0.44–1.00)
Calcium: 8.1 mg/dL — ABNORMAL LOW (ref 8.9–10.3)
GFR calc Af Amer: >60 mL/min (ref >60)
GFR calc non Af Amer: >60 mL/min (ref >60)
Glucose, Bld: 140 mg/dL — ABNORMAL HIGH (ref 65–99)
Potassium: 3.3 mmol/L — ABNORMAL LOW (ref 3.5–5.1)
Sodium: 132 mmol/L — ABNORMAL LOW (ref 135–145)
Total Bilirubin: 0.8 mg/dL (ref 0.3–1.2)
Total Protein: 5.9 g/dL — ABNORMAL LOW (ref 6.5–8.1)

## 2018-02-07 MED ORDER — HYDROCODONE-ACETAMINOPHEN 5-325 MG PO TABS: 1.0000 | ORAL_TABLET | Freq: Four times a day (QID) | ORAL | 0 refills | Status: AC | PRN

## 2018-02-07 MED ORDER — TRAMADOL HCL 50 MG PO TABS: 50.0000 mg | ORAL_TABLET | Freq: Four times a day (QID) | ORAL | 0 refills | Status: AC | PRN

## 2018-02-07 NOTE — Telephone Encounter (Signed)
Rx sent to [facility] phone : [phone] , fax : [phone]  

## 2018-02-08 ENCOUNTER — Encounter: Payer: Self-pay | Admitting: Gerontology

## 2018-02-08 ENCOUNTER — Non-Acute Institutional Stay (SKILLED_NURSING_FACILITY): Payer: Medicare Other | Admitting: Gerontology

## 2018-02-08 DIAGNOSIS — S82142A Displaced bicondylar fracture of left tibia, initial encounter for closed fracture: Secondary | ICD-10-CM | POA: Diagnosis not present

## 2018-02-08 DIAGNOSIS — E46 Unspecified protein-calorie malnutrition: Secondary | ICD-10-CM | POA: Diagnosis not present

## 2018-02-08 DIAGNOSIS — Z967 Presence of other bone and tendon implants: Secondary | ICD-10-CM

## 2018-02-08 DIAGNOSIS — Z8781 Personal history of (healed) traumatic fracture: Secondary | ICD-10-CM

## 2018-02-08 DIAGNOSIS — Z9889 Other specified postprocedural states: Secondary | ICD-10-CM

## 2018-02-08 NOTE — Progress Notes (Signed)
Location:   [facility] Room Number: [place] of Service:  SNF [phone]) Provider:  Toni Arthurs, NP-C  [name], MD  Patient Care Team: [name], MD as PCP - General (Internal Medicine)  Extended Emergency Contact Information Primary Emergency Contact: Pfeffer,John Address: [address]          [place] [address] Johnnette Litter of [place] Phone: [phone] Mobile Phone: [phone] Relation: Son  Code Status:  DNR Goals of care: Advanced Directive information Advanced Directives 02/03/2018  Does Patient Have a Medical Advance Directive? Yes  Type of Advance Directive Living will;Healthcare Power of Attorney  Does patient want to make changes to medical advance directive? -  Copy of [place] in Chart? No - copy requested     Chief Complaint  Patient presents with  . Medical Management of Chronic Issues    Routine Visit    HPI:  Pt is a 84 y.o. female seen today for medical management of chronic diseases. Pt was admitted to the facility for rehab after hospitalization at [facility] after fall off chair resulting in Left [place] Fracture with ORIF. Pt has been participating in PT/OT. Pt reports her pain is fairly well controlled on current regimen. Pt does have 2-3+ LLE/ pedal pitting edema. Acewrap dressing was not removed to evaluate, but LLE is obviously edematous and exposed foot is 3+ pitting edema. Pulse palpable. Toes cool but with normal cap refill times. Compartments soft, supple. Bledsoe brace in place. Labs assessed on admission. Pt was found to have mild protein-calorie malnutrition. Will supplement for wound healing. Pt reports her appetite is good. She is voiding well and having regular BMs. VSS. No other complaints.        Past Medical History:  Diagnosis Date  . Asthma   . Cancer ([place])   . Carcinoma ([place])    basal cell  . Hyperlipidemia   . Hypertension   . Hyperthyroidism     . Thyroid disease    Past Surgical History:  Procedure Laterality Date  . ABDOMINAL HYSTERECTOMY     10/10/1974 - 10/10/1975  . NASAL ENDOSCOPY    . NASAL SINUS SURGERY    . OOPHORECTOMY    . ORIF TIBIA PLATEAU Left 02/04/2018   Procedure: OPEN REDUCTION INTERNAL FIXATION (ORIF) TIBIAL PLATEAU;  Surgeon: Shona Needles, MD;  Location: [place];  Service: Orthopedics;  Laterality: Left;  . PELVIC FLOOR REPAIR    . TONSILLECTOMY     10/10/1956 - 10/09/1957  . URETHROPEXY     ANTERIOR AND POSTERIOR (RAZ)    Allergies  Allergen Reactions  . Amoxicillin     REACTION: rash Has patient had a PCN reaction causing immediate rash, facial/tongue/throat swelling, SOB or lightheadedness with hypotension: No Has patient had a PCN reaction causing severe rash involving mucus membranes or skin necrosis: Yes Has patient had a PCN reaction that required hospitalization: No Has patient had a PCN reaction occurring within the last 10 years-No If all of the above answers are "NO", then may proceed with Cephalosporin use.   . Atorvastatin     REACTION: muscle pain  . Conjugated Estrogens   . Fenofibrate     REACTION: muscle pain  . Pravastatin Sodium     REACTION: insomnia  . Propoxyphene Hcl     REACTION: nausea  . Rosuvastatin     REACTION: muscle pain  . Penicillins Rash    Allergies as of 02/08/2018  Reactions   Amoxicillin    REACTION: rash Has patient had a PCN reaction causing immediate rash, facial/tongue/throat swelling, SOB or lightheadedness with hypotension: No Has patient had a PCN reaction causing severe rash involving mucus membranes or skin necrosis: Yes Has patient had a PCN reaction that required hospitalization: No Has patient had a PCN reaction occurring within the last 10 years-No If all of the above answers are "NO", then may proceed with Cephalosporin use.   Atorvastatin    REACTION: muscle pain   Conjugated Estrogens    Fenofibrate    REACTION: muscle pain    Pravastatin Sodium    REACTION: insomnia   Propoxyphene Hcl    REACTION: nausea   Rosuvastatin    REACTION: muscle pain   Penicillins Rash      Medication List        Accurate as of 02/08/18  2:05 PM. Always use your most recent med list.          acetaminophen 325 MG tablet Commonly known as:  TYLENOL Take 2 tablets (650 mg total) by mouth every 6 (six) hours as needed for mild pain (use first).   bisacodyl 10 MG suppository Commonly known as:  DULCOLAX Place 10 mg rectally daily as needed.   CENTRUM SILVER 50+WOMEN PO Take 1 tablet by mouth at bedtime.   cholecalciferol 1000 units tablet Commonly known as:  VITAMIN D Take 1,000 Units by mouth daily.   cyanocobalamin 1000 MCG tablet Take 1,000 mcg by mouth daily.   docusate sodium 100 MG capsule Commonly known as:  COLACE Take 1 capsule (100 mg total) by mouth 2 (two) times daily.   enoxaparin 40 MG/0.4ML injection Commonly known as:  LOVENOX Inject 0.4 mLs (40 mg total) into the skin daily.   ENSURE ENLIVE PO Take 1 Bottle by mouth 2 (two) times daily.   feeding supplement (PRO-STAT SUGAR FREE 64) Liqd Take 30 mLs by mouth 2 (two) times daily between meals.   gabapentin 600 MG tablet Commonly known as:  NEURONTIN Take 600 mg by mouth daily as needed (pain.).   HYDROcodone-acetaminophen 5-325 MG tablet Commonly known as:  NORCO/VICODIN Take 1-2 tablets by mouth every 6 (six) hours as needed for moderate pain.   levothyroxine 25 MCG tablet Commonly known as:  SYNTHROID, LEVOTHROID Take 25 mcg by mouth daily before breakfast.   metoprolol tartrate 25 MG tablet Commonly known as:  LOPRESSOR Take 25 mg by mouth 2 (two) times daily.   sennosides-docusate sodium 8.6-50 MG tablet Commonly known as:  SENOKOT-S Take 1 tablet by mouth 2 (two) times daily.   traMADol 50 MG tablet Commonly known as:  ULTRAM Take 1 tablet (50 mg total) by mouth every 6 (six) hours as needed for severe pain.       Review  of Systems  Constitutional: Negative for activity change, appetite change, chills, diaphoresis and fever.  HENT: Negative for congestion, mouth sores, nosebleeds, postnasal drip, sneezing, sore throat, trouble swallowing and voice change.   Respiratory: Negative for apnea, cough, choking, chest tightness, shortness of breath and wheezing.   Cardiovascular: Positive for leg swelling. Negative for chest pain and palpitations.  Gastrointestinal: Negative for abdominal distention, abdominal pain, constipation, diarrhea and nausea.  Genitourinary: Negative for difficulty urinating, dysuria, frequency and urgency.  Musculoskeletal: Positive for arthralgias (typical arthritis) and gait problem. Negative for back pain and myalgias.  Skin: Positive for wound. Negative for color change, pallor and rash.  Neurological: Positive for weakness. Negative for dizziness, tremors,  syncope, speech difficulty, numbness and headaches.  Psychiatric/Behavioral: Negative for agitation and behavioral problems.  All other systems reviewed and are negative.   Immunization History  Administered Date(s) Administered  . Influenza,inj,quad, With Preservative 07/10/2017  . Influenza-Unspecified 07/11/2014, 07/28/2015, 07/01/2016, 06/30/2017  . Td 12/09/1996  . Zoster 08/21/2014   Pertinent  Health Maintenance Due  Topic Date Due  . DEXA SCAN  01/28/1999  . PNA vac Low Risk Adult (1 of 2 - PCV13) 01/28/1999  . INFLUENZA VACCINE  05/10/2018   No flowsheet data found. Functional Status Survey:    Vitals:   02/08/18 1228  BP: (!) 122/56  Pulse: 83  Resp: 18  Temp: 97.8 F (36.6 C)  TempSrc: Oral  SpO2: 99%  Weight: 146 lb 8 oz (66.5 kg)  Height: 5' 2.5" (1.588 m)   Body mass index is 26.37 kg/m. Physical Exam  Constitutional: She is oriented to person, place, and time. Vital signs are normal. She appears well-developed and well-nourished. She is active and cooperative. She does not appear ill. No distress.   HENT:  Head: Normocephalic and atraumatic.  Mouth/Throat: Uvula is midline, oropharynx is clear and moist and mucous membranes are normal. Mucous membranes are not pale, not dry and not cyanotic.  Eyes: Pupils are equal, round, and reactive to light. Conjunctivae, EOM and lids are normal.  Neck: Trachea normal, normal range of motion and full passive range of motion without pain. Neck supple. No JVD present. No tracheal deviation, no edema and no erythema present. No thyromegaly present.  Cardiovascular: Normal rate, regular rhythm, intact distal pulses and normal pulses. Exam reveals no gallop, no distant heart sounds and no friction rub.  Murmur heard. Pulses:      Dorsalis pedis pulses are 2+ on the right side, and 2+ on the left side.  2-3+ LLE pitting edema  Pulmonary/Chest: Effort normal and breath sounds normal. No accessory muscle usage. No respiratory distress. She has no decreased breath sounds. She has no wheezes. She has no rhonchi. She has no rales. She exhibits no tenderness.  Abdominal: Soft. Normal appearance and bowel sounds are normal. She exhibits no distension and no ascites. There is no tenderness.  Musculoskeletal: Normal range of motion.       Left lower leg: She exhibits tenderness, swelling, edema and laceration.  Expected osteoarthritis, stiffness; Bilateral Calves soft, supple. Negative Homan's Sign. B- pedal pulses equal  Neurological: She is alert and oriented to person, place, and time. She has normal strength.  Skin: Skin is warm and dry. Laceration noted. She is not diaphoretic. No cyanosis. No pallor. Nails show no clubbing.  Psychiatric: She has a normal mood and affect. Her speech is normal and behavior is normal. Judgment and thought content normal. Cognition and memory are normal.  Nursing note and vitals reviewed.   Labs reviewed: Recent Labs    02/02/18 1513 02/03/18 0336 02/07/18 0940  NA 134*  --  132*  K 4.0  --  3.3*  CL 99*  --  96*  CO2 22   --  28  GLUCOSE 103*  --  140*  BUN 23*  --  13  CREATININE 0.96 1.03* 0.75  CALCIUM 9.0  --  8.1*   Recent Labs    02/07/18 0940  AST 34  ALT 18  ALKPHOS 57  BILITOT 0.8  PROT 5.9*  ALBUMIN 3.2*   Recent Labs    02/02/18 1513 02/03/18 0336 02/05/18 0639 02/07/18 0940  WBC 14.4* 6.6 9.4 7.7  NEUTROABS 11.4*  --   --  5.4  HGB 12.4 10.4* 8.7* 10.1*  HCT 38.0 31.7* 26.3* 29.5*  MCV 80.9 80.7 79.7 79.9*  PLT 308 283 271 345   Lab Results  Component Value Date   TSH 2.68 03/13/2008   No results found for: HGBA1C Lab Results  Component Value Date   CHOL 222 (HH) 01/02/2008   HDL 56.8 01/02/2008   LDLDIRECT 154.2 01/02/2008   TRIG 72 01/02/2008   CHOLHDL 3.9 CALC 01/02/2008    Significant Diagnostic Results in last 30 days:  Dg Knee 1-2 Views Left  Result Date: 02/04/2018 CLINICAL DATA:  Tibial plateau fractures.  Subsequent encounter. EXAM: DG C-ARM 61-120 MIN; LEFT KNEE - 5 VIEW COMPARISON:  02/02/2018 FINDINGS: Five fluoroscopic spot images show placement of internal fixation plate and screws transfixing a comminuted fracture of the proximal tibial metaphysis and lateral tibial plateau in near anatomic alignment. Mildly displaced fracture of the proximal fibular metaphysis is also seen. IMPRESSION: Internal fixation of proximal tibial fracture in near anatomic alignment. Fracture of proximal fibular metaphysis also noted. Electronically Signed   By: Earle Gell M.D.   On: 02/04/2018 14:07   Dg Knee 1-2 Views Left  Result Date: 02/02/2018 CLINICAL DATA:  Recent fall with knee deformity, initial encounter EXAM: LEFT KNEE - 1-2 VIEW COMPARISON:  None. FINDINGS: Comminuted fracture of the proximal tibia is noted with impaction at the fracture site. Fracture lines extend into the articular surface laterally. Fat fluid effusion is noted. Proximal fibular fracture is noted as well. IMPRESSION: Proximal tibial and fibular fractures. Electronically Signed   By: Inez Catalina M.D.    On: 02/02/2018 15:30   Dg Tibia/fibula Left  Result Date: 02/02/2018 CLINICAL DATA:  84 year old female status post fall with left knee pain and deformity EXAM: LEFT TIBIA AND FIBULA - 2 VIEW COMPARISON:  Concurrently obtained radiographs of the left knee FINDINGS: Acute comminuted tibial plateau fracture and acute fracture of the fibular neck. No evidence of additional fracture in the mid or distal lower leg. The ankle joint appears intact. No focal soft tissue abnormality. IMPRESSION: Acute comminuted tibial plateau fracture. Acute impacted fracture of the fibular neck. Electronically Signed   By: Jacqulynn Cadet M.D.   On: 02/02/2018 15:28   Ct Knee Left Wo Contrast  Result Date: 02/02/2018 CLINICAL DATA:  Left knee pain after fall off chair. EXAM: CT OF THE LEFT KNEE WITHOUT CONTRAST TECHNIQUE: Multidetector CT imaging of the LEFT knee was performed according to the standard protocol. Multiplanar CT image reconstructions were also generated. COMPARISON:  Left knee radiographs 02/02/2018 FINDINGS: Bones/Joint/Cartilage Lipohemarthrosis involving the suprapatellar compartment of the knee. This is associated with a comminuted fracture of the proximal tibia involving the medial and lateral tibial plateau. 1. A sagittally oriented fracture is seen through the lateral tibial plateau with approximately 5 mm of lateral displacement. 2. A subtle transverse fracture lucency extends into the medial tibial plateau along the posterior third without displacement or depression. 3. A transverse fracture through the tibial metaphysis is noted with an approximately 3.2 x 0.8 x 2 cm lateral tibial cortical fragment noted with minimal lateral displacement and its proximal margin interposed between the displaced tibial plateau fracture and medial plateau fragment. 4. Comminuted fracture of the left fibular head and neck with impaction of the neck on the head. 5. No joint dislocation.  Intact patella. Ligaments  Suboptimally assessed by CT. Muscles and Tendons No intramuscular hemorrhage. No atrophy. Intact extensor mechanism. Soft tissues  Mild periarticular soft tissue edema along the anteromedial aspect of the knee. IMPRESSION: 1. Schatzker type VI tibial plateau fracture predominantly unicondylar involving the left lateral tibial plateau with 5 mm of lateral displacement of the lateral tibial plateau fracture fragment. A transverse comminuted fracture through the tibial metaphysis is also noted with nondisplaced fracture lucency extending to the posterior medial tibial plateau surface but without significant displacement or depression. 2. Comminuted impacted fibular head and neck fracture. 3. Lipohemarthrosis. Electronically Signed   By: Ashley Royalty M.D.   On: 02/02/2018 18:07   Chest Portable 1 View  Result Date: 02/02/2018 CLINICAL DATA:  Initial evaluation for preoperative evaluation. EXAM: PORTABLE CHEST 1 VIEW COMPARISON:  Prior radiograph from 12/03/2010. FINDINGS: Cardiomegaly, stable. Mediastinal silhouette normal. Aortic atherosclerosis. No focal infiltrates. No pulmonary edema or pleural effusion. No pneumothorax. No acute osseus abnormality.  Diffuse osteopenia noted. IMPRESSION: 1. No active cardiopulmonary disease. 2. Stable cardiomegaly without edema. 3. Aortic atherosclerosis. Electronically Signed   By: Jeannine Boga M.D.   On: 02/02/2018 22:02   Dg Knee Left Port  Result Date: 02/04/2018 CLINICAL DATA:  Status post ORIF of left tibia fractures. EXAM: PORTABLE LEFT KNEE - 1-2 VIEW COMPARISON:  02/02/2018. FINDINGS: Lateral fixation plate and associated screws have reduce the fractures of the proximal tibia and lateral tibial plateau. Fractures appear in near anatomic alignment. No change in the alignment of the comminuted proximal fibular fracture. Knee joint is normally aligned. IMPRESSION: 1. Well aligned proximal left tibial fractures following ORIF. Electronically Signed   By: Lajean Manes M.D.   On: 02/04/2018 14:06   Dg C-arm 1-60 Min  Result Date: 02/04/2018 CLINICAL DATA:  Tibial plateau fractures.  Subsequent encounter. EXAM: DG C-ARM 61-120 MIN; LEFT KNEE - 5 VIEW COMPARISON:  02/02/2018 FINDINGS: Five fluoroscopic spot images show placement of internal fixation plate and screws transfixing a comminuted fracture of the proximal tibial metaphysis and lateral tibial plateau in near anatomic alignment. Mildly displaced fracture of the proximal fibular metaphysis is also seen. IMPRESSION: Internal fixation of proximal tibial fracture in near anatomic alignment. Fracture of proximal fibular metaphysis also noted. Electronically Signed   By: Earle Gell M.D.   On: 02/04/2018 14:07   Dg C-arm 1-60 Min  Result Date: 02/04/2018 CLINICAL DATA:  Tibial plateau fractures.  Subsequent encounter. EXAM: DG C-ARM 61-120 MIN; LEFT KNEE - 5 VIEW COMPARISON:  02/02/2018 FINDINGS: Five fluoroscopic spot images show placement of internal fixation plate and screws transfixing a comminuted fracture of the proximal tibial metaphysis and lateral tibial plateau in near anatomic alignment. Mildly displaced fracture of the proximal fibular metaphysis is also seen. IMPRESSION: Internal fixation of proximal tibial fracture in near anatomic alignment. Fracture of proximal fibular metaphysis also noted. Electronically Signed   By: Earle Gell M.D.   On: 02/04/2018 14:07    Assessment/Plan  Closed bicondylar fracture of left tibial plateau  S/P ORIF (open reduction internal fixation) fracture  Protein-calorie malnutrition, unspecified severity ([place])   Continue to work with PT/OT  Continue exercises as taught by PT/OT  Continue Lovenox 40 mg SQ Q Day for DVT prophylaxis  Ice pack to the leg QID and PRN for pain, edema  Elevate leg on pillows with full support from ankle to thigh for edema control  Continue Norco 5/325 mg 1-2 tablets po Q 6 hours prn pain  Bledsoe brace at all  times  Assess skin integrity Q shift  Pro-Stat 30 mL PO BID  ENsure ENlive 1 bottle po  BID   Continue Senna S 1 tablet po BID for constipation  Follow up with orthopedist as instructed  Family/ staff Communication:   Total Time:  Documentation:  Face to Face:  Family/Phone:   Labs/tests ordered:  Not due- recent labs reviewed  Medication list reviewed and assessed for continued appropriateness. Monthly medication orders reviewed and signed.  Vikki Ports, NP-C Geriatrics [facility] [phone], [place] [address] Cell Phone (Mon-Fri 8am-5pm):  [phone] On Call:  [phone] & follow prompts after 5pm & weekends Office Phone:  [phone] Office Fax:  [phone]

## 2018-02-09 DIAGNOSIS — E46 Unspecified protein-calorie malnutrition: Secondary | ICD-10-CM | POA: Insufficient documentation

## 2018-02-09 DIAGNOSIS — Z9889 Other specified postprocedural states: Secondary | ICD-10-CM | POA: Insufficient documentation

## 2018-02-09 DIAGNOSIS — Z8781 Personal history of (healed) traumatic fracture: Secondary | ICD-10-CM

## 2018-02-16 ENCOUNTER — Non-Acute Institutional Stay (SKILLED_NURSING_FACILITY): Payer: Medicare Other | Admitting: Gerontology

## 2018-02-16 ENCOUNTER — Encounter: Payer: Self-pay | Admitting: Gerontology

## 2018-02-16 DIAGNOSIS — S82142A Displaced bicondylar fracture of left tibia, initial encounter for closed fracture: Secondary | ICD-10-CM | POA: Diagnosis not present

## 2018-02-16 DIAGNOSIS — Z967 Presence of other bone and tendon implants: Secondary | ICD-10-CM | POA: Diagnosis not present

## 2018-02-16 DIAGNOSIS — Z9889 Other specified postprocedural states: Secondary | ICD-10-CM

## 2018-02-16 DIAGNOSIS — E46 Unspecified protein-calorie malnutrition: Secondary | ICD-10-CM | POA: Diagnosis not present

## 2018-02-16 DIAGNOSIS — Z8781 Personal history of (healed) traumatic fracture: Secondary | ICD-10-CM | POA: Diagnosis not present

## 2018-02-16 NOTE — Progress Notes (Signed)
Location:   [facility] Room Number: [place] of Service:  SNF [phone]) Provider:  Toni Arthurs, NP-C  [name], MD  Patient Care Team: [name], MD as PCP - General (Internal Medicine)  Extended Emergency Contact Information Primary Emergency Contact: Bouyer,John Address: [address]          [place] [address] [place] of [place] Phone: [phone] Mobile Phone: [phone] Relation: Son  Code Status:  DNR Goals of care: Advanced Directive information Advanced Directives 02/16/2018  Does Patient Have a Medical Advance Directive? Yes  Type of Advance Directive Out of facility DNR (pink MOST or yellow form);Living will;Healthcare Power of Attorney  Does patient want to make changes to medical advance directive? No - Patient declined  Copy of [place] in Chart? Yes  Pre-existing out of facility DNR order (yellow form or pink MOST form) -     Chief Complaint  Patient presents with  . Medical Management of Chronic Issues    Routine Visit    HPI:  Pt is a 84 y.o. female seen today for medical management of chronic diseases. Pt was admitted to the facility for rehab following hospitalization for fall off a chair resulting in Left Tibial Plateau fracture with subsequent ORIF. Pt has been participating in PT/OT. Pt has been progressing well. Pt reports her pain is well controlled on current regimen. She reports her appetite is improving and is eating better. She reports she is voiding well and having regular BMs. VSS. No other complaints except she wants her hair washed.      Past Medical History:  Diagnosis Date  . Asthma   . Cancer ([place])   . Carcinoma ([place])    basal cell  . Hyperlipidemia   . Hypertension   . Hyperthyroidism   . Thyroid disease    Past Surgical History:  Procedure Laterality Date  . ABDOMINAL HYSTERECTOMY     10/10/1974 - 10/10/1975  . NASAL ENDOSCOPY    . NASAL SINUS SURGERY      . OOPHORECTOMY    . ORIF TIBIA PLATEAU Left 02/04/2018   Procedure: OPEN REDUCTION INTERNAL FIXATION (ORIF) TIBIAL PLATEAU;  Surgeon: Shona Needles, MD;  Location: [facility];  Service: Orthopedics;  Laterality: Left;  . PELVIC FLOOR REPAIR    . TONSILLECTOMY     10/10/1956 - 10/09/1957  . URETHROPEXY     ANTERIOR AND POSTERIOR (RAZ)    Allergies  Allergen Reactions  . Amoxicillin     REACTION: rash Has patient had a PCN reaction causing immediate rash, facial/tongue/throat swelling, SOB or lightheadedness with hypotension: No Has patient had a PCN reaction causing severe rash involving mucus membranes or skin necrosis: Yes Has patient had a PCN reaction that required hospitalization: No Has patient had a PCN reaction occurring within the last 10 years-No If all of the above answers are "NO", then may proceed with Cephalosporin use.   . Atorvastatin     REACTION: muscle pain  . Conjugated Estrogens   . Fenofibrate     REACTION: muscle pain  . Pravastatin Sodium     REACTION: insomnia  . Propoxyphene Hcl     REACTION: nausea  . Rosuvastatin     REACTION: muscle pain  . Penicillins Rash    Allergies as of 02/16/2018      Reactions   Amoxicillin    REACTION: rash Has patient had a PCN reaction causing immediate rash, facial/tongue/throat swelling, SOB or lightheadedness  with hypotension: No Has patient had a PCN reaction causing severe rash involving mucus membranes or skin necrosis: Yes Has patient had a PCN reaction that required hospitalization: No Has patient had a PCN reaction occurring within the last 10 years-No If all of the above answers are "NO", then may proceed with Cephalosporin use.   Atorvastatin    REACTION: muscle pain   Conjugated Estrogens    Fenofibrate    REACTION: muscle pain   Pravastatin Sodium    REACTION: insomnia   Propoxyphene Hcl    REACTION: nausea   Rosuvastatin    REACTION: muscle pain   Penicillins Rash      Medication List         Accurate as of 02/16/18 11:45 AM. Always use your most recent med list.          acetaminophen 325 MG tablet Commonly known as:  TYLENOL Take 2 tablets (650 mg total) by mouth every 6 (six) hours as needed for mild pain (use first).   alum & mag hydroxide-simeth 604-540-98 MG/5ML suspension Commonly known as:  MAALOX PLUS Take 30 mLs by mouth every 4 (four) hours as needed.   bisacodyl 10 MG suppository Commonly known as:  DULCOLAX Place 10 mg rectally daily as needed.   CENTRUM SILVER 50+WOMEN PO Take 1 tablet by mouth at bedtime.   cholecalciferol 1000 units tablet Commonly known as:  VITAMIN D Take 1,000 Units by mouth daily.   cyanocobalamin 1000 MCG tablet Take 1,000 mcg by mouth daily.   docusate sodium 100 MG capsule Commonly known as:  COLACE Take 1 capsule (100 mg total) by mouth 2 (two) times daily.   enoxaparin 40 MG/0.4ML injection Commonly known as:  LOVENOX Inject 0.4 mLs (40 mg total) into the skin daily.   ENSURE ENLIVE PO Take 1 Bottle by mouth 2 (two) times daily.   feeding supplement (PRO-STAT SUGAR FREE 64) Liqd Take 30 mLs by mouth 2 (two) times daily between meals.   gabapentin 600 MG tablet Commonly known as:  NEURONTIN Take 600 mg by mouth daily as needed (pain.).   HYDROcodone-acetaminophen 5-325 MG tablet Commonly known as:  NORCO/VICODIN Take 1-2 tablets by mouth every 6 (six) hours as needed for moderate pain.   levothyroxine 25 MCG tablet Commonly known as:  SYNTHROID, LEVOTHROID Take 25 mcg by mouth daily before breakfast.   metoprolol tartrate 25 MG tablet Commonly known as:  LOPRESSOR Take 25 mg by mouth 2 (two) times daily.   sennosides-docusate sodium 8.6-50 MG tablet Commonly known as:  SENOKOT-S Take 1 tablet by mouth 2 (two) times daily.   traMADol 50 MG tablet Commonly known as:  ULTRAM Take 1 tablet (50 mg total) by mouth every 6 (six) hours as needed for severe pain.       Review of Systems  Constitutional:  Negative for activity change, appetite change, chills, diaphoresis and fever.  HENT: Negative for congestion, mouth sores, nosebleeds, postnasal drip, sneezing, sore throat, trouble swallowing and voice change.   Respiratory: Negative for apnea, cough, choking, chest tightness, shortness of breath and wheezing.   Cardiovascular: Negative for chest pain, palpitations and leg swelling.  Gastrointestinal: Negative for abdominal distention, abdominal pain, constipation, diarrhea and nausea.  Genitourinary: Negative for difficulty urinating, dysuria, frequency and urgency.  Musculoskeletal: Positive for arthralgias (typical arthritis) and gait problem. Negative for back pain and myalgias.  Skin: Positive for wound. Negative for color change, pallor and rash.  Neurological: Positive for weakness. Negative for dizziness, tremors,  syncope, speech difficulty, numbness and headaches.  Psychiatric/Behavioral: Negative for agitation and behavioral problems.  All other systems reviewed and are negative.   Immunization History  Administered Date(s) Administered  . Influenza,inj,quad, With Preservative 07/10/2017  . Influenza-Unspecified 07/11/2014, 07/28/2015, 07/01/2016, 06/30/2017  . Td 12/09/1996  . Zoster 08/21/2014   Pertinent  Health Maintenance Due  Topic Date Due  . DEXA SCAN  01/28/1999  . PNA vac Low Risk Adult (1 of 2 - PCV13) 01/28/1999  . INFLUENZA VACCINE  05/10/2018   No flowsheet data found. Functional Status Survey:    Vitals:   02/16/18 1136  BP: 135/81  Pulse: 82  Resp: 20  Temp: 98.1 F (36.7 C)  TempSrc: Oral  SpO2: 98%  Weight: 145 lb (65.8 kg)  Height: 5' 2.5" (1.588 m)   Body mass index is 26.1 kg/m. Physical Exam  Constitutional: She is oriented to person, place, and time. Vital signs are normal. She appears well-developed and well-nourished. She is active and cooperative. She does not appear ill. No distress.  HENT:  Head: Normocephalic and atraumatic.    Mouth/Throat: Uvula is midline, oropharynx is clear and moist and mucous membranes are normal. Mucous membranes are not pale, not dry and not cyanotic.  Eyes: Pupils are equal, round, and reactive to light. Conjunctivae, EOM and lids are normal.  Neck: Trachea normal, normal range of motion and full passive range of motion without pain. Neck supple. No JVD present. No tracheal deviation, no edema and no erythema present. No thyromegaly present.  Cardiovascular: Normal rate, normal heart sounds, intact distal pulses and normal pulses. An irregular rhythm present. Exam reveals no gallop, no distant heart sounds and no friction rub.  No murmur heard. Pulses:      Dorsalis pedis pulses are 2+ on the right side, and 2+ on the left side.  1+ LLE edema  Pulmonary/Chest: Effort normal and breath sounds normal. No accessory muscle usage. No respiratory distress. She has no decreased breath sounds. She has no wheezes. She has no rhonchi. She has no rales. She exhibits no tenderness.  Abdominal: Soft. Normal appearance and bowel sounds are normal. She exhibits no distension and no ascites. There is no tenderness.  Musculoskeletal: Normal range of motion.       Left lower leg: She exhibits tenderness, edema and laceration.  Expected osteoarthritis, stiffness; Bilateral Calves soft, supple. Negative Homan's Sign. B- pedal pulses equal  Neurological: She is alert and oriented to person, place, and time. She has normal strength.  Skin: Skin is warm and dry. Laceration noted. She is not diaphoretic. No cyanosis. No pallor. Nails show no clubbing.  Psychiatric: She has a normal mood and affect. Her speech is normal and behavior is normal. Judgment and thought content normal. Cognition and memory are normal.  Nursing note and vitals reviewed.   Labs reviewed: Recent Labs    02/02/18 1513 02/03/18 0336 02/07/18 0940  NA 134*  --  132*  K 4.0  --  3.3*  CL 99*  --  96*  CO2 22  --  28  GLUCOSE 103*  --   140*  BUN 23*  --  13  CREATININE 0.96 1.03* 0.75  CALCIUM 9.0  --  8.1*   Recent Labs    02/07/18 0940  AST 34  ALT 18  ALKPHOS 57  BILITOT 0.8  PROT 5.9*  ALBUMIN 3.2*   Recent Labs    02/02/18 1513 02/03/18 0336 02/05/18 0639 02/07/18 0940  WBC 14.4* 6.6 9.4  7.7  NEUTROABS 11.4*  --   --  5.4  HGB 12.4 10.4* 8.7* 10.1*  HCT 38.0 31.7* 26.3* 29.5*  MCV 80.9 80.7 79.7 79.9*  PLT 308 283 271 345   Lab Results  Component Value Date   TSH 2.68 03/13/2008   No results found for: HGBA1C Lab Results  Component Value Date   CHOL 222 (HH) 01/02/2008   HDL 56.8 01/02/2008   LDLDIRECT 154.2 01/02/2008   TRIG 72 01/02/2008   CHOLHDL 3.9 CALC 01/02/2008    Significant Diagnostic Results in last 30 days:  Dg Knee 1-2 Views Left  Result Date: 02/04/2018 CLINICAL DATA:  Tibial plateau fractures.  Subsequent encounter. EXAM: DG C-ARM 61-120 MIN; LEFT KNEE - 5 VIEW COMPARISON:  02/02/2018 FINDINGS: Five fluoroscopic spot images show placement of internal fixation plate and screws transfixing a comminuted fracture of the proximal tibial metaphysis and lateral tibial plateau in near anatomic alignment. Mildly displaced fracture of the proximal fibular metaphysis is also seen. IMPRESSION: Internal fixation of proximal tibial fracture in near anatomic alignment. Fracture of proximal fibular metaphysis also noted. Electronically Signed   By: Earle Gell M.D.   On: 02/04/2018 14:07   Dg Knee 1-2 Views Left  Result Date: 02/02/2018 CLINICAL DATA:  Recent fall with knee deformity, initial encounter EXAM: LEFT KNEE - 1-2 VIEW COMPARISON:  None. FINDINGS: Comminuted fracture of the proximal tibia is noted with impaction at the fracture site. Fracture lines extend into the articular surface laterally. Fat fluid effusion is noted. Proximal fibular fracture is noted as well. IMPRESSION: Proximal tibial and fibular fractures. Electronically Signed   By: Inez Catalina M.D.   On: 02/02/2018 15:30    Dg Tibia/fibula Left  Result Date: 02/02/2018 CLINICAL DATA:  84 year old female status post fall with left knee pain and deformity EXAM: LEFT TIBIA AND FIBULA - 2 VIEW COMPARISON:  Concurrently obtained radiographs of the left knee FINDINGS: Acute comminuted tibial plateau fracture and acute fracture of the fibular neck. No evidence of additional fracture in the mid or distal lower leg. The ankle joint appears intact. No focal soft tissue abnormality. IMPRESSION: Acute comminuted tibial plateau fracture. Acute impacted fracture of the fibular neck. Electronically Signed   By: Jacqulynn Cadet M.D.   On: 02/02/2018 15:28   Ct Knee Left Wo Contrast  Result Date: 02/02/2018 CLINICAL DATA:  Left knee pain after fall off chair. EXAM: CT OF THE LEFT KNEE WITHOUT CONTRAST TECHNIQUE: Multidetector CT imaging of the LEFT knee was performed according to the standard protocol. Multiplanar CT image reconstructions were also generated. COMPARISON:  Left knee radiographs 02/02/2018 FINDINGS: Bones/Joint/Cartilage Lipohemarthrosis involving the suprapatellar compartment of the knee. This is associated with a comminuted fracture of the proximal tibia involving the medial and lateral tibial plateau. 1. A sagittally oriented fracture is seen through the lateral tibial plateau with approximately 5 mm of lateral displacement. 2. A subtle transverse fracture lucency extends into the medial tibial plateau along the posterior third without displacement or depression. 3. A transverse fracture through the tibial metaphysis is noted with an approximately 3.2 x 0.8 x 2 cm lateral tibial cortical fragment noted with minimal lateral displacement and its proximal margin interposed between the displaced tibial plateau fracture and medial plateau fragment. 4. Comminuted fracture of the left fibular head and neck with impaction of the neck on the head. 5. No joint dislocation.  Intact patella. Ligaments Suboptimally assessed by CT.  Muscles and Tendons No intramuscular hemorrhage. No atrophy. Intact extensor mechanism.  Soft tissues Mild periarticular soft tissue edema along the anteromedial aspect of the knee. IMPRESSION: 1. Schatzker type VI tibial plateau fracture predominantly unicondylar involving the left lateral tibial plateau with 5 mm of lateral displacement of the lateral tibial plateau fracture fragment. A transverse comminuted fracture through the tibial metaphysis is also noted with nondisplaced fracture lucency extending to the posterior medial tibial plateau surface but without significant displacement or depression. 2. Comminuted impacted fibular head and neck fracture. 3. Lipohemarthrosis. Electronically Signed   By: Ashley Royalty M.D.   On: 02/02/2018 18:07   Chest Portable 1 View  Result Date: 02/02/2018 CLINICAL DATA:  Initial evaluation for preoperative evaluation. EXAM: PORTABLE CHEST 1 VIEW COMPARISON:  Prior radiograph from 12/03/2010. FINDINGS: Cardiomegaly, stable. Mediastinal silhouette normal. Aortic atherosclerosis. No focal infiltrates. No pulmonary edema or pleural effusion. No pneumothorax. No acute osseus abnormality.  Diffuse osteopenia noted. IMPRESSION: 1. No active cardiopulmonary disease. 2. Stable cardiomegaly without edema. 3. Aortic atherosclerosis. Electronically Signed   By: Jeannine Boga M.D.   On: 02/02/2018 22:02   Dg Knee Left Port  Result Date: 02/04/2018 CLINICAL DATA:  Status post ORIF of left tibia fractures. EXAM: PORTABLE LEFT KNEE - 1-2 VIEW COMPARISON:  02/02/2018. FINDINGS: Lateral fixation plate and associated screws have reduce the fractures of the proximal tibia and lateral tibial plateau. Fractures appear in near anatomic alignment. No change in the alignment of the comminuted proximal fibular fracture. Knee joint is normally aligned. IMPRESSION: 1. Well aligned proximal left tibial fractures following ORIF. Electronically Signed   By: Lajean Manes M.D.   On: 02/04/2018  14:06   Dg C-arm 1-60 Min  Result Date: 02/04/2018 CLINICAL DATA:  Tibial plateau fractures.  Subsequent encounter. EXAM: DG C-ARM 61-120 MIN; LEFT KNEE - 5 VIEW COMPARISON:  02/02/2018 FINDINGS: Five fluoroscopic spot images show placement of internal fixation plate and screws transfixing a comminuted fracture of the proximal tibial metaphysis and lateral tibial plateau in near anatomic alignment. Mildly displaced fracture of the proximal fibular metaphysis is also seen. IMPRESSION: Internal fixation of proximal tibial fracture in near anatomic alignment. Fracture of proximal fibular metaphysis also noted. Electronically Signed   By: Earle Gell M.D.   On: 02/04/2018 14:07   Dg C-arm 1-60 Min  Result Date: 02/04/2018 CLINICAL DATA:  Tibial plateau fractures.  Subsequent encounter. EXAM: DG C-ARM 61-120 MIN; LEFT KNEE - 5 VIEW COMPARISON:  02/02/2018 FINDINGS: Five fluoroscopic spot images show placement of internal fixation plate and screws transfixing a comminuted fracture of the proximal tibial metaphysis and lateral tibial plateau in near anatomic alignment. Mildly displaced fracture of the proximal fibular metaphysis is also seen. IMPRESSION: Internal fixation of proximal tibial fracture in near anatomic alignment. Fracture of proximal fibular metaphysis also noted. Electronically Signed   By: Earle Gell M.D.   On: 02/04/2018 14:07    Assessment/Plan  Closed bicondylar fracture of left tibial plateau  S/P ORIF (open reduction internal fixation) fracture  Protein-calorie malnutrition, unspecified severity (HCC)   Continue PT/OT  Continue exercises as taught by PT/OT  Continue Bledsoe brace as instructed  Continue QID ice packs and elevation for edema  Continue Tramadol 50 mg po Q 6 hours prn pain  Continue Tylenol 650 mg po Q 6 hours prn pain  Continue Norco 5/325 mg 1-2 tablets po Q 4 hours prn pain  Continue Lovenox 40 mg SQ Q Day for DVT prophylaxis  Continue Pro-stat 30  mL po BID for protein supplementation  Continue Ensure Enlive 1  bottle po BID for nutritional supplementation  Skin care per protocol  Assist with ADLs and hair washing as needed  Follow up with Orthopedist as instructed  Family/ staff Communication:   Total Time:  Documentation:  Face to Face:  Family/Phone:   Labs/tests ordered:  Cbc, met c next week  Medication list reviewed and assessed for continued appropriateness. Monthly medication orders reviewed and signed.  Vikki Ports, NP-C Geriatrics [facility] [phone], [place] [address] Cell Phone (Mon-Fri 8am-5pm):  [phone] On Call:  [phone] & follow prompts after 5pm & weekends Office Phone:  [phone] Office Fax:  [phone]

## 2018-02-20 ENCOUNTER — Other Ambulatory Visit
Admission: RE | Admit: 2018-02-20 | Discharge: 2018-02-20 | Disposition: A | Discharge status: Home / Self Care | Payer: No Typology Code available for payment source | Source: Ambulatory Visit | Attending: Gerontology | Admitting: Gerontology

## 2018-02-20 DIAGNOSIS — I129 Hypertensive chronic kidney disease with stage 1 through stage 4 chronic kidney disease, or unspecified chronic kidney disease: Secondary | ICD-10-CM | POA: Insufficient documentation

## 2018-02-20 LAB — CBC WITH DIFFERENTIAL/PLATELET
BASOS ABS: 0.1 10*3/uL (ref 0–0.1)
BASOS PCT: 1 %
EOS ABS: 0.1 10*3/uL (ref 0–0.7)
EOS PCT: 2 %
HCT: 33.2 % — ABNORMAL LOW (ref 35.0–47.0)
Hemoglobin: 10.9 g/dL — ABNORMAL LOW (ref 12.0–16.0)
LYMPHS ABS: 1.5 10*3/uL (ref 1.0–3.6)
Lymphocytes Relative: 24 %
MCH: 26.9 pg (ref 26.0–34.0)
MCHC: 32.9 g/dL (ref 32.0–36.0)
MCV: 81.6 fL (ref 80.0–100.0)
Monocytes Absolute: 0.6 10*3/uL (ref 0.2–0.9)
Monocytes Relative: 11 %
Neutro Abs: 3.8 10*3/uL (ref 1.4–6.5)
Neutrophils Relative %: 62 %
Platelets: 601 10*3/uL — ABNORMAL HIGH (ref 150–440)
RBC: 4.07 MIL/uL (ref 3.80–5.20)
RDW: 14.9 % — ABNORMAL HIGH (ref 11.5–14.5)
WBC: 6.1 10*3/uL (ref 3.6–11.0)

## 2018-02-20 LAB — COMPREHENSIVE METABOLIC PANEL
ALT: 16 U/L (ref 14–54)
AST: 15 U/L (ref 15–41)
Albumin: 3.2 g/dL — ABNORMAL LOW (ref 3.5–5.0)
Alkaline Phosphatase: 137 U/L — ABNORMAL HIGH (ref 38–126)
Anion gap: 6 (ref 5–15)
BUN: 16 mg/dL (ref 6–20)
CO2: 27 mmol/L (ref 22–32)
CREATININE: 0.65 mg/dL (ref 0.44–1.00)
Calcium: 8.3 mg/dL — ABNORMAL LOW (ref 8.9–10.3)
Chloride: 99 mmol/L — ABNORMAL LOW (ref 101–111)
GFR calc non Af Amer: >60 mL/min (ref >60)
Glucose, Bld: 96 mg/dL (ref 65–99)
Potassium: 4 mmol/L (ref 3.5–5.1)
Sodium: 132 mmol/L — ABNORMAL LOW (ref 135–145)
Total Bilirubin: 0.5 mg/dL (ref 0.3–1.2)
Total Protein: 6.2 g/dL — ABNORMAL LOW (ref 6.5–8.1)

## 2018-02-27 ENCOUNTER — Non-Acute Institutional Stay (SKILLED_NURSING_FACILITY): Payer: Medicare Other | Admitting: Gerontology

## 2018-02-27 DIAGNOSIS — S82142A Displaced bicondylar fracture of left tibia, initial encounter for closed fracture: Secondary | ICD-10-CM | POA: Diagnosis not present

## 2018-02-27 DIAGNOSIS — Z967 Presence of other bone and tendon implants: Secondary | ICD-10-CM

## 2018-02-27 DIAGNOSIS — E46 Unspecified protein-calorie malnutrition: Secondary | ICD-10-CM

## 2018-02-27 DIAGNOSIS — Z8781 Personal history of (healed) traumatic fracture: Secondary | ICD-10-CM | POA: Diagnosis not present

## 2018-02-27 DIAGNOSIS — Z9889 Other specified postprocedural states: Secondary | ICD-10-CM

## 2018-03-10 ENCOUNTER — Encounter
Admission: RE | Admit: 2018-03-10 | Discharge: 2018-03-10 | Disposition: A | Discharge status: Home / Self Care | Payer: Medicare Other | Source: Ambulatory Visit | Attending: Internal Medicine | Admitting: Internal Medicine

## 2018-04-04 NOTE — Progress Notes (Signed)
Location:      Place of Service:  SNF (31) Provider:  Toni Arthurs, NP-C  [name], MD  Patient Care Team: [name], MD as PCP - General (Internal Medicine)  Extended Emergency Contact Information Primary Emergency Contact: Samantha Hogan Address: [address]          [place] [address] Samantha Hogan Phone: [phone] Mobile Phone: [phone] Relation: Son  Code Status: DNR Goals of care: Advanced Directive information Advanced Directives 02/16/2018  Does Patient Have a Medical Advance Directive? Yes  Type of Advance Directive Out of facility DNR (pink MOST or yellow form);Living will;Healthcare Power of Attorney  Does patient want to make changes to medical advance directive? No - Patient declined  Copy of [place] in Chart? Yes  Pre-existing out of facility DNR order (yellow form or pink MOST form) -     Chief Complaint  Patient presents with  . Medical Management of Chronic Issues    HPI:  Pt is a 84 y.o. female seen today for medical management of chronic diseases.  Patient was admitted to the facility for rehab following hospitalization at [facility] after mechanical fall from a chair resulting in a left tibial plateau fracture with subsequent ORIF.  Patient has been participating in PT/OT.  Patient has been progressing well with PT/OT.  Patient reports her pain is well controlled on current regimen.  Bledsoe in brace for support.  Mild edema to the left ankle.  Patient reports she has been trying to keep her leg elevated on pillows when at rest.  Patient reports her appetite is okay.  She is voiding well and having regular BMs.  Vital signs stable.  No other complaints.     Past Medical History:  Diagnosis Date  . Asthma   . Cancer ([place])   . Carcinoma ([place])    basal cell  . Hyperlipidemia   . Hypertension   . Hyperthyroidism   . Thyroid disease    Past Surgical History:  Procedure Laterality Date  . ABDOMINAL  HYSTERECTOMY     10/10/1974 - 10/10/1975  . NASAL ENDOSCOPY    . NASAL SINUS SURGERY    . OOPHORECTOMY    . ORIF TIBIA PLATEAU Left 02/04/2018   Procedure: OPEN REDUCTION INTERNAL FIXATION (ORIF) TIBIAL PLATEAU;  Surgeon: Shona Needles, MD;  Location: [facility];  Service: Orthopedics;  Laterality: Left;  . PELVIC FLOOR REPAIR    . TONSILLECTOMY     10/10/1956 - 10/09/1957  . URETHROPEXY     ANTERIOR AND POSTERIOR ([facility])    Allergies  Allergen Reactions  . Amoxicillin     REACTION: rash Has patient had a PCN reaction causing immediate rash, facial/tongue/throat swelling, SOB or lightheadedness with hypotension: No Has patient had a PCN reaction causing severe rash involving mucus membranes or skin necrosis: Yes Has patient had a PCN reaction that required hospitalization: No Has patient had a PCN reaction occurring within the last 10 years-No If all of the above answers are "NO", then may proceed with Cephalosporin use.   . Atorvastatin     REACTION: muscle pain  . Conjugated Estrogens   . Fenofibrate     REACTION: muscle pain  . Pravastatin Sodium     REACTION: insomnia  . Propoxyphene Hcl     REACTION: nausea  . Rosuvastatin     REACTION: muscle pain  . Penicillins Rash    Allergies as of 02/27/2018      Reactions  Amoxicillin    REACTION: rash Has patient had a PCN reaction causing immediate rash, facial/tongue/throat swelling, SOB or lightheadedness with hypotension: No Has patient had a PCN reaction causing severe rash involving mucus membranes or skin necrosis: Yes Has patient had a PCN reaction that required hospitalization: No Has patient had a PCN reaction occurring within the last 10 years-No If all of the above answers are "NO", then may proceed with Cephalosporin use.   Atorvastatin    REACTION: muscle pain   Conjugated Estrogens    Fenofibrate    REACTION: muscle pain   Pravastatin Sodium    REACTION: insomnia   Propoxyphene Hcl    REACTION: nausea    Rosuvastatin    REACTION: muscle pain   Penicillins Rash      Medication List        Accurate as of 02/27/18 11:59 PM. Always use your most recent med list.          acetaminophen 325 MG tablet Commonly known as:  TYLENOL Take 2 tablets (650 mg total) by mouth every 6 (six) hours as needed for mild pain (use first).   alum & mag hydroxide-simeth 101-751-02 MG/5ML suspension Commonly known as:  MAALOX PLUS Take 30 mLs by mouth every 4 (four) hours as needed.   bisacodyl 10 MG suppository Commonly known as:  DULCOLAX Place 10 mg rectally daily as needed.   CENTRUM SILVER 50+WOMEN PO Take 1 tablet by mouth at bedtime.   cholecalciferol 1000 units tablet Commonly known as:  VITAMIN D Take 1,000 Units by mouth daily.   cyanocobalamin 1000 MCG tablet Take 1,000 mcg by mouth daily.   docusate sodium 100 MG capsule Commonly known as:  COLACE Take 1 capsule (100 mg total) by mouth 2 (two) times daily.   enoxaparin 40 MG/0.4ML injection Commonly known as:  LOVENOX Inject 0.4 mLs (40 mg total) into the skin daily.   ENSURE ENLIVE PO Take 1 Bottle by mouth 2 (two) times daily.   feeding supplement (PRO-STAT SUGAR FREE 64) Liqd Take 30 mLs by mouth 2 (two) times daily between meals.   gabapentin 600 MG tablet Commonly known as:  NEURONTIN Take 600 mg by mouth daily as needed (pain.).   HYDROcodone-acetaminophen 5-325 MG tablet Commonly known as:  NORCO/VICODIN Take 1-2 tablets by mouth every 6 (six) hours as needed for moderate pain.   levothyroxine 25 MCG tablet Commonly known as:  SYNTHROID, LEVOTHROID Take 25 mcg by mouth daily before breakfast.   metoprolol tartrate 25 MG tablet Commonly known as:  LOPRESSOR Take 25 mg by mouth 2 (two) times daily.   sennosides-docusate sodium 8.6-50 MG tablet Commonly known as:  SENOKOT-S Take 1 tablet by mouth 2 (two) times daily.   traMADol 50 MG tablet Commonly known as:  ULTRAM Take 1 tablet (50 mg total) by mouth  every 6 (six) hours as needed for severe pain.       Review of Systems  Constitutional: Negative for activity change, appetite change, chills, diaphoresis and fever.  HENT: Negative for congestion, mouth sores, nosebleeds, postnasal drip, sneezing, sore throat, trouble swallowing and voice change.   Respiratory: Negative for apnea, cough, choking, chest tightness, shortness of breath and wheezing.   Cardiovascular: Positive for leg swelling. Negative for chest pain and palpitations.  Gastrointestinal: Negative for abdominal distention, abdominal pain, constipation, diarrhea and nausea.  Genitourinary: Negative for difficulty urinating, dysuria, frequency and urgency.  Musculoskeletal: Positive for gait problem. Negative for back pain and myalgias. Arthralgias: typical arthritis.  Skin: Positive for wound. Negative for color change, pallor and rash.  Neurological: Positive for weakness. Negative for dizziness, tremors, syncope, speech difficulty, numbness and headaches.  Psychiatric/Behavioral: Negative for agitation and behavioral problems.  All other systems reviewed and are negative.   Immunization History  Administered Date(s) Administered  . Influenza,inj,quad, With Preservative 07/10/2017  . Influenza-Unspecified 07/11/2014, 07/28/2015, 07/01/2016, 06/30/2017  . Td 12/09/1996  . Zoster 08/21/2014   Pertinent  Health Maintenance Due  Topic Date Due  . DEXA SCAN  01/28/1999  . PNA vac Low Risk Adult (1 of 2 - PCV13) 01/28/1999  . INFLUENZA VACCINE  05/10/2018   No flowsheet data found. Functional Status Survey:    Vitals:   02/27/18 0815  BP: (!) 156/70  Pulse: 84  Resp: 20  Temp: 97.8 F (36.6 C)  SpO2: 100%   There is no height or weight on file to calculate BMI. Physical Exam  Constitutional: She is oriented to person, place, and time. Vital signs are normal. She appears well-developed and well-nourished. She is active and cooperative. She does not appear ill. No  distress.  HENT:  Head: Normocephalic and atraumatic.  Mouth/Throat: Uvula is midline, oropharynx is clear and moist and mucous membranes are normal. Mucous membranes are not pale, not dry and not cyanotic.  Eyes: Pupils are equal, round, and reactive to light. Conjunctivae, EOM and lids are normal.  Neck: Trachea normal, normal range of motion and full passive range of motion without pain. Neck supple. No JVD present. No tracheal deviation, no edema and no erythema present. No thyromegaly present.  Cardiovascular: Normal rate, regular rhythm, normal heart sounds, intact distal pulses and normal pulses. Exam reveals no gallop, no distant heart sounds and no friction rub.  No murmur heard. Pulses:      Dorsalis pedis pulses are 2+ on the right side, and 2+ on the left side.  1-2+ LLE edema  Pulmonary/Chest: Effort normal and breath sounds normal. No accessory muscle usage. No respiratory distress. She has no decreased breath sounds. She has no wheezes. She has no rhonchi. She has no rales. She exhibits no tenderness.  Abdominal: Soft. Normal appearance and bowel sounds are normal. She exhibits no distension and no ascites. There is no tenderness.  Musculoskeletal: Normal range of motion.       Left lower leg: She exhibits tenderness, edema and laceration.  Expected osteoarthritis, stiffness; Bilateral Calves soft, supple. Negative Homan's Sign. B- pedal pulses equal  Neurological: She is alert and oriented to person, place, and time. She has normal strength.  Skin: Skin is warm and dry. Laceration noted. She is not diaphoretic. No cyanosis. No pallor. Nails show no clubbing.  Psychiatric: She has a normal mood and affect. Her speech is normal and behavior is normal. Judgment and thought content normal. Cognition and memory are normal.  Nursing note and vitals reviewed.   Labs reviewed: Recent Labs    02/02/18 1513 02/03/18 0336 02/07/18 0940 02/20/18 0600  NA 134*  --  132* 132*  K 4.0   --  3.3* 4.0  CL 99*  --  96* 99*  CO2 22  --  28 27  GLUCOSE 103*  --  140* 96  BUN 23*  --  13 16  CREATININE 0.96 1.03* 0.75 0.65  CALCIUM 9.0  --  8.1* 8.3*   Recent Labs    02/07/18 0940 02/20/18 0600  AST 34 15  ALT 18 16  ALKPHOS 57 137*  BILITOT 0.8 0.5  PROT  5.9* 6.2*  ALBUMIN 3.2* 3.2*   Recent Labs    02/02/18 1513  02/05/18 0639 02/07/18 0940 02/20/18 0600  WBC 14.4*   < > 9.4 7.7 6.1  NEUTROABS 11.4*  --   --  5.4 3.8  HGB 12.4   < > 8.7* 10.1* 10.9*  HCT 38.0   < > 26.3* 29.5* 33.2*  MCV 80.9   < > 79.7 79.9* 81.6  PLT 308   < > 271 345 601*   < > = values in this interval not displayed.   Lab Results  Component Value Date   TSH 2.68 03/13/2008   No results found for: HGBA1C Lab Results  Component Value Date   CHOL 222 (HH) 01/02/2008   HDL 56.8 01/02/2008   LDLDIRECT 154.2 01/02/2008   TRIG 72 01/02/2008   CHOLHDL 3.9 CALC 01/02/2008    Significant Diagnostic Results in last 30 days:  No results found.  Assessment/Plan Samantha Hogan was seen today for medical management of chronic issues.  Diagnoses and all orders for this visit:  Closed bicondylar fracture of left tibial plateau  S/P ORIF (open reduction internal fixation) fracture  Protein-calorie malnutrition, unspecified severity (HCC)   Continue PT/OT  Continue exercises taught by PT/OT  Continue Bledsoe brace as instructed  Continue 4 times daily ice packs and elevation for edema  TED hose daily.  On in the early a.m., off in the p.m.  Continue tramadol 50 mg p.o. every 6 hours as needed pain  Continue Tylenol 650 mg p.o. every 6 hours as needed pain  Continue Norco 5/325 mg 1 to 2 tablets p.o. every 4 hours as needed pain  Continue Lovenox 40 mg subcu daily for DVT prophylaxis x10 more days  Continue Protostat 30 mL p.o. twice daily for protein supplementation  Continue Ensure Enlive 1 bottle p.o. twice daily for nutritional supplementation  Skin care per  protocol  Assist with ADLs and mobility as necessary  Follow-up with orthopedist as instructed  Family/ staff Communication:   Total Time:  Documentation:  Face to Face:  Family/Phone:   Labs/tests ordered:    Medication list reviewed and assessed for continued appropriateness. Monthly medication orders reviewed and signed.  Vikki Ports, NP-C Geriatrics [facility] [phone], [place] [address] Cell Phone (Mon-Fri 8am-5pm):  [phone] On Call:  [phone] & follow prompts after 5pm & weekends Office Phone:  [phone] Office Fax:  [phone]

## 2019-09-12 IMAGING — DX DG CHEST 1V PORT
1 series · 1 of 1 positions shown · non-contrast
Comparison: Prior radiograph from 12/03/2010.

CLINICAL DATA: Initial evaluation for preoperative evaluation.

EXAM:
PORTABLE CHEST 1 VIEW

[chest ap]
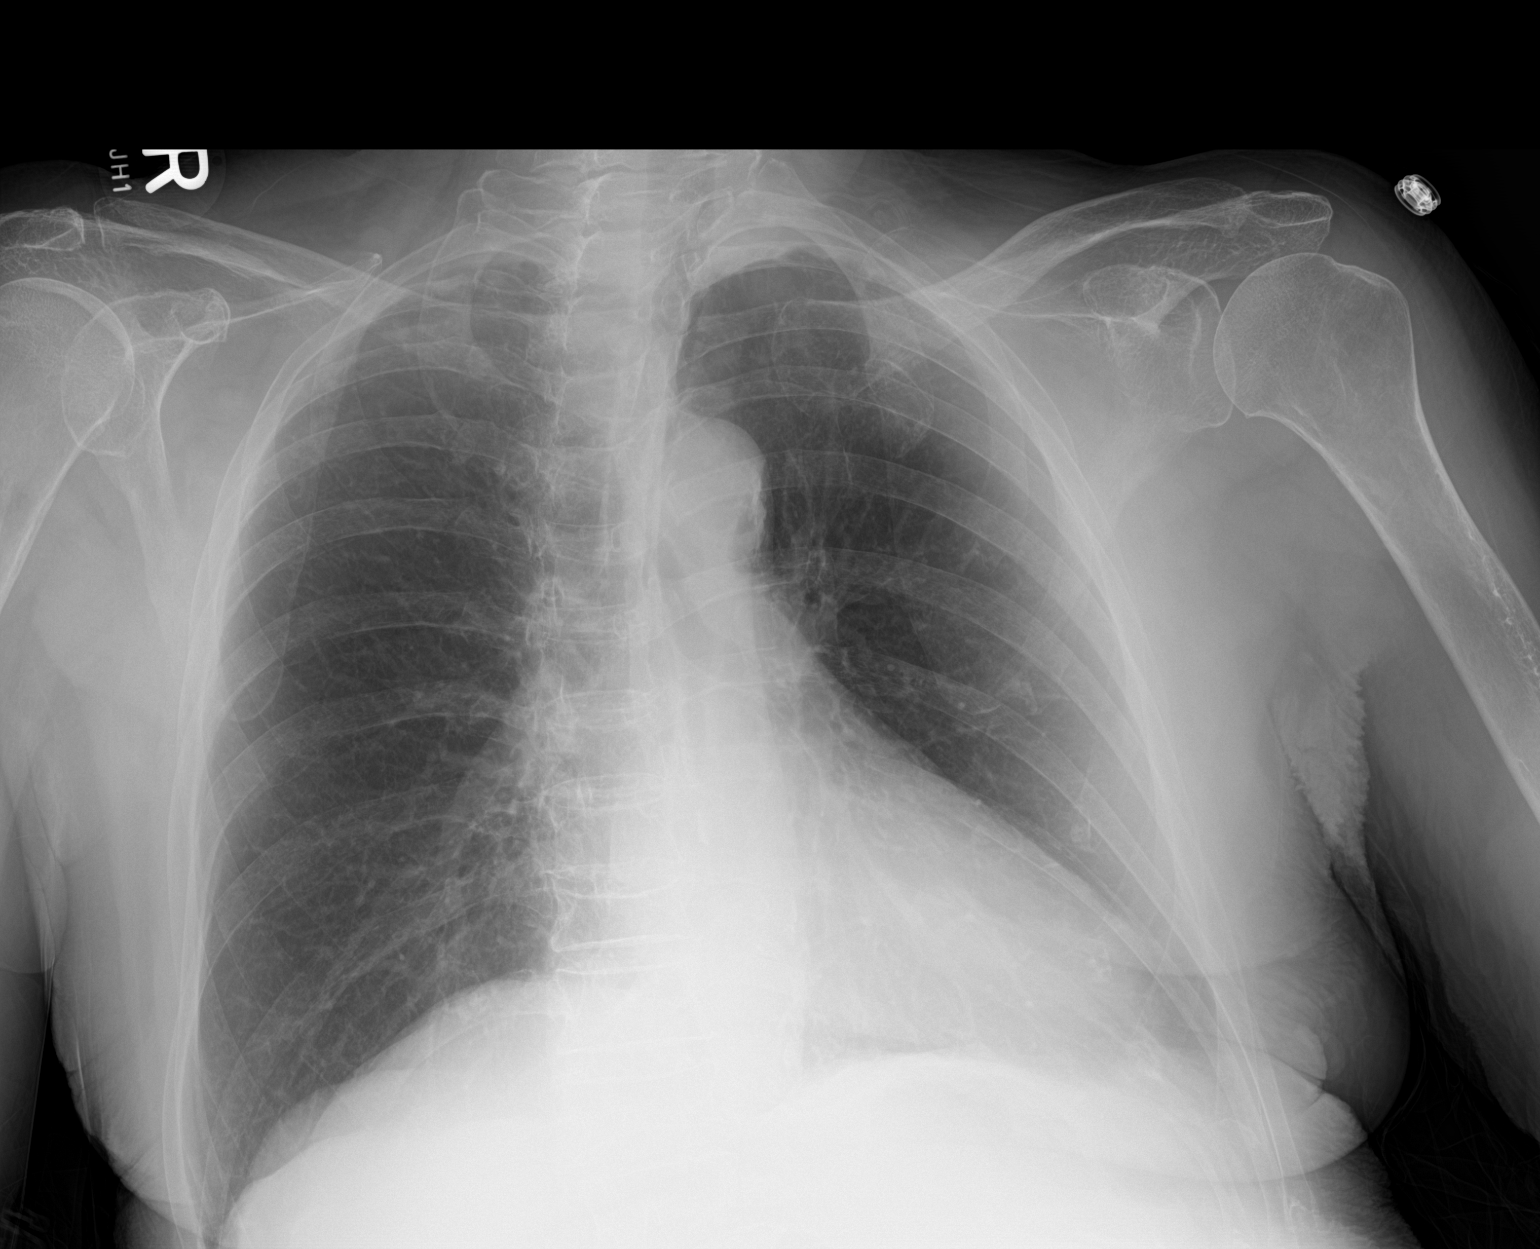

[1 of 1 positions shown; findings below may reference images not displayed]

FINDINGS: Cardiomegaly, stable. Mediastinal silhouette normal. Aortic
atherosclerosis.

No focal infiltrates. No pulmonary edema or pleural effusion. No
pneumothorax.

No acute osseus abnormality.  Diffuse osteopenia noted.
IMPRESSION: 1. No active cardiopulmonary disease.
2. Stable cardiomegaly without edema.
3. Aortic atherosclerosis.

## 2019-09-12 IMAGING — CR DG KNEE 1-2V*L*
2 series · 2 of 2 positions shown · non-contrast
Comparison: None.

CLINICAL DATA: Recent fall with knee deformity, initial encounter

EXAM:
LEFT KNEE - 1-2 VIEW

[x knee ap left]
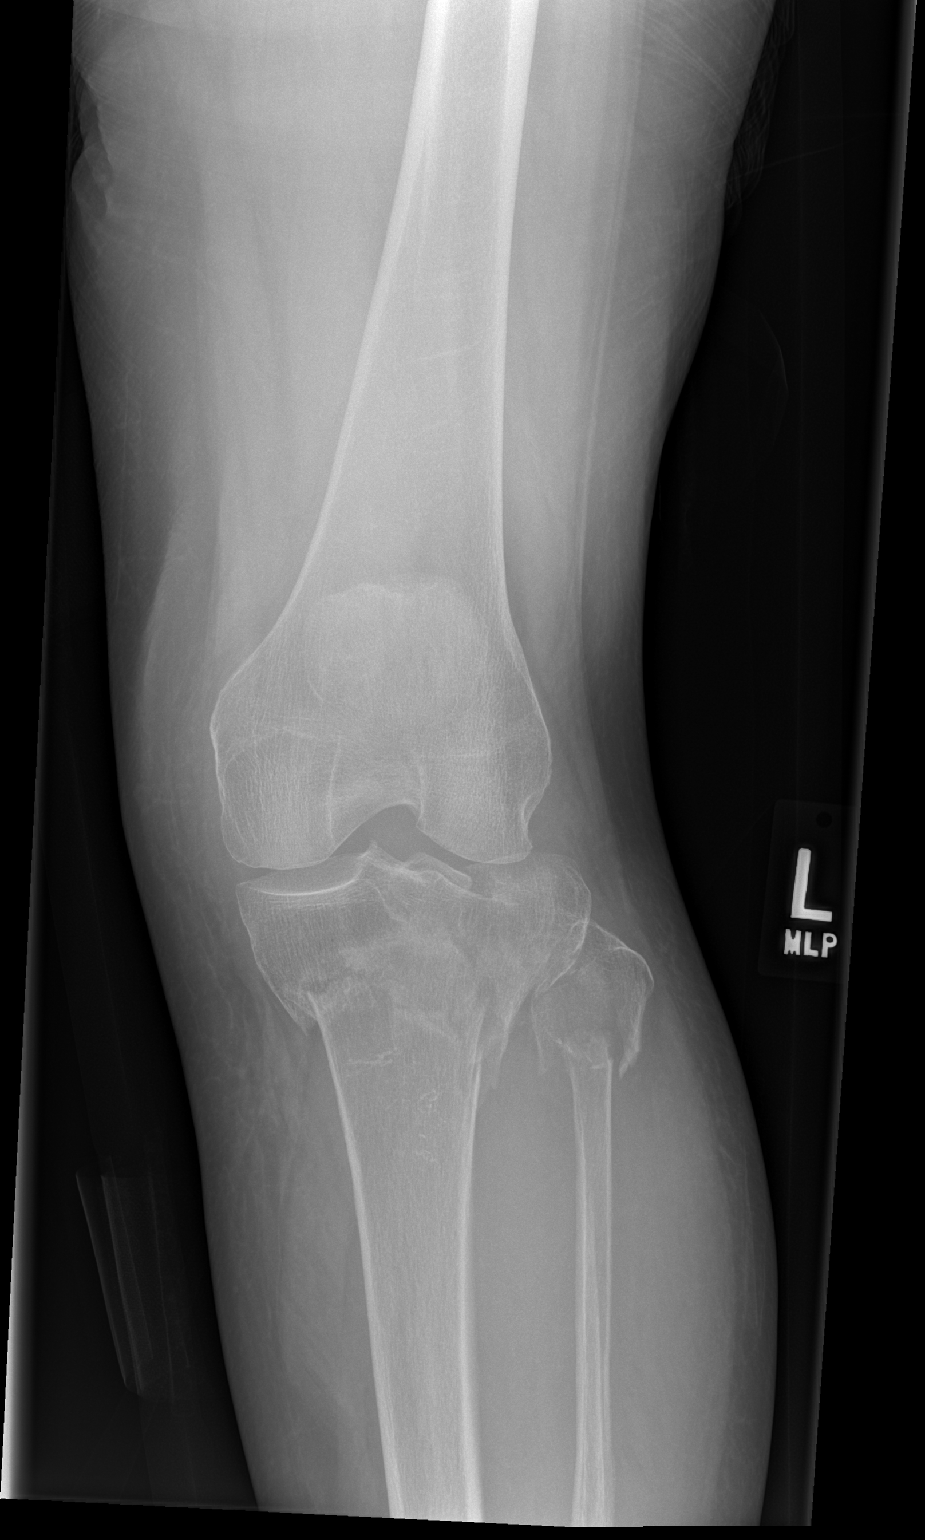

[x knee lat left]
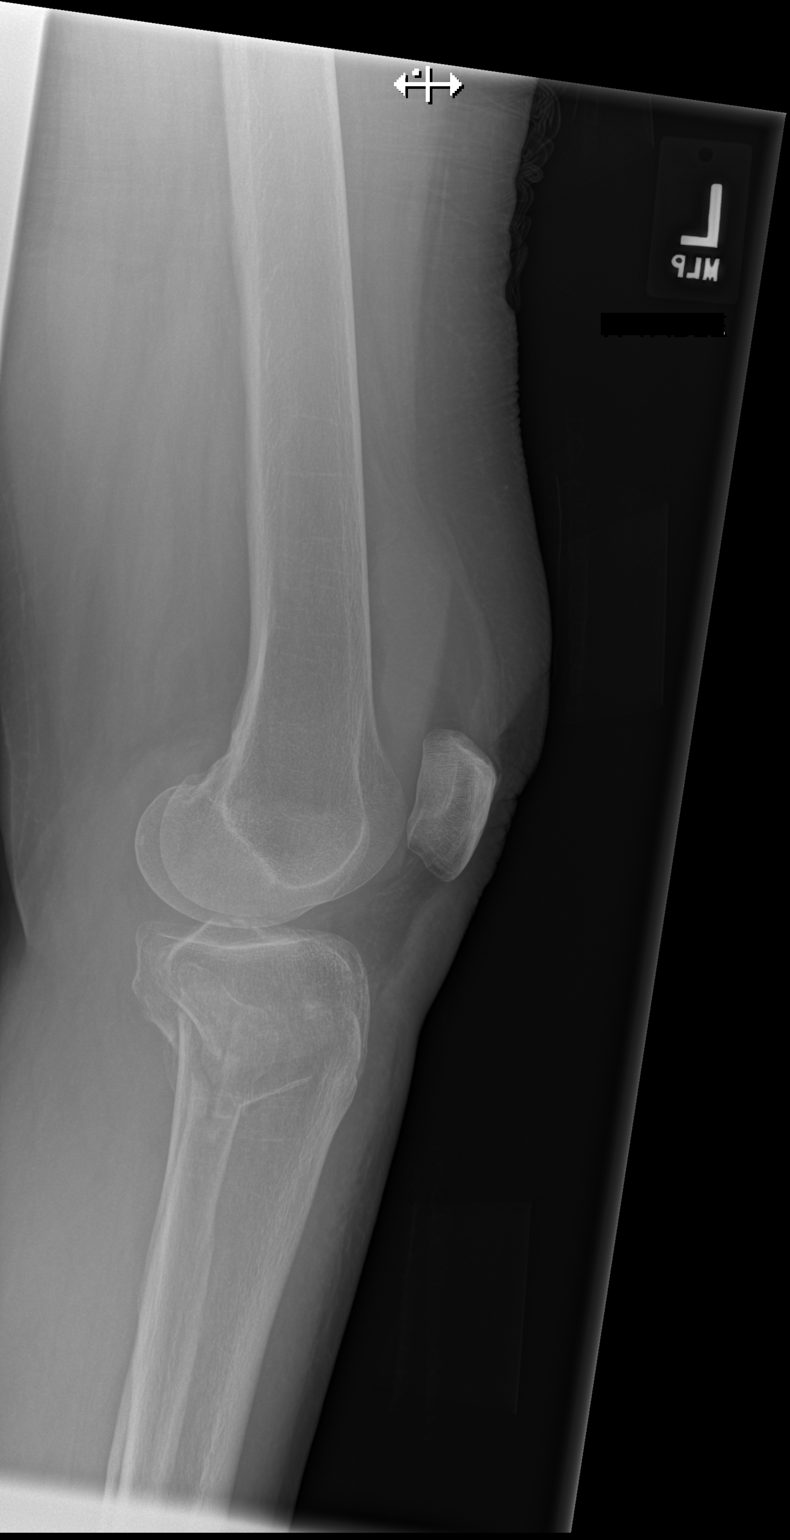

[2 of 2 positions shown; findings below may reference images not displayed]

FINDINGS: Comminuted fracture of the proximal tibia is noted with impaction at
the fracture site. Fracture lines extend into the articular surface
laterally. Fat fluid effusion is noted. Proximal fibular fracture is
noted as well.
IMPRESSION: Proximal tibial and fibular fractures.

## 2019-09-14 IMAGING — DX DG KNEE 1-2V PORT*L*
2 series · 2 of 2 positions shown · non-contrast
Comparison: 02/02/2018.

CLINICAL DATA: Status post ORIF of left tibia fractures.

EXAM:
PORTABLE LEFT KNEE - 1-2 VIEW

[knee ap]
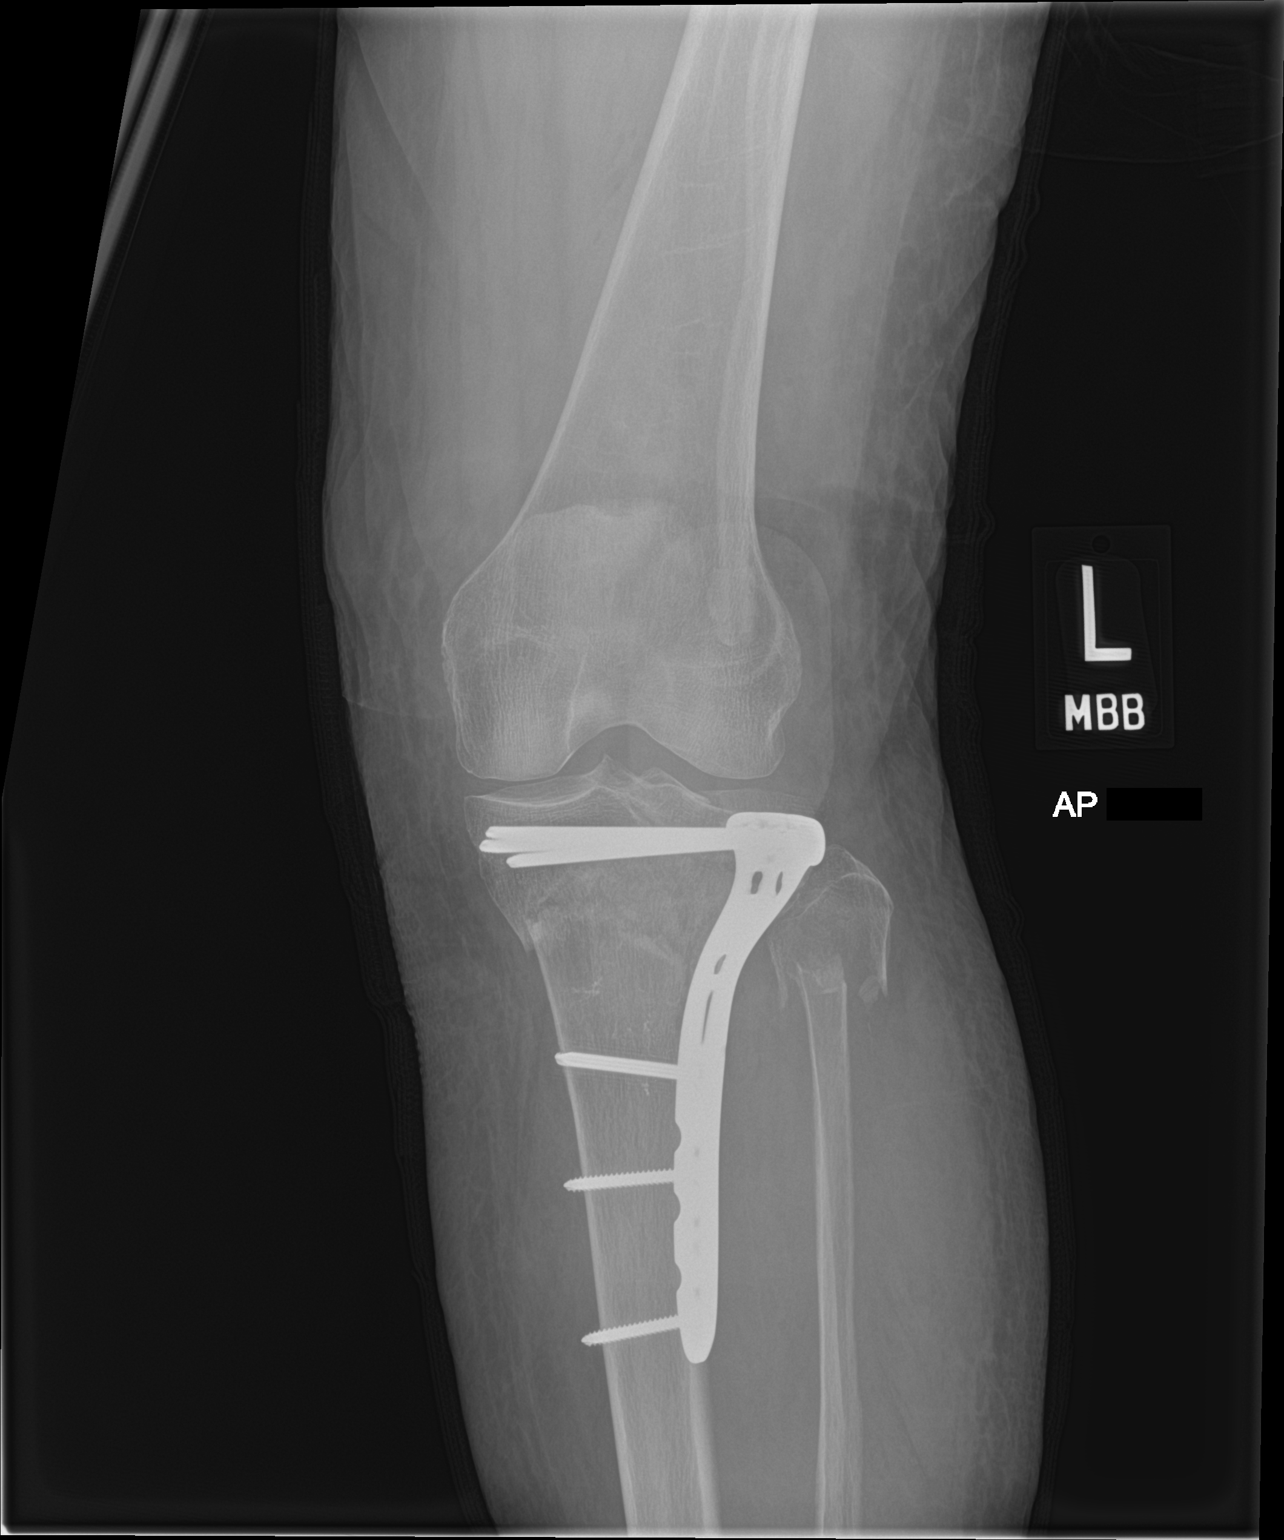

[knee lat]
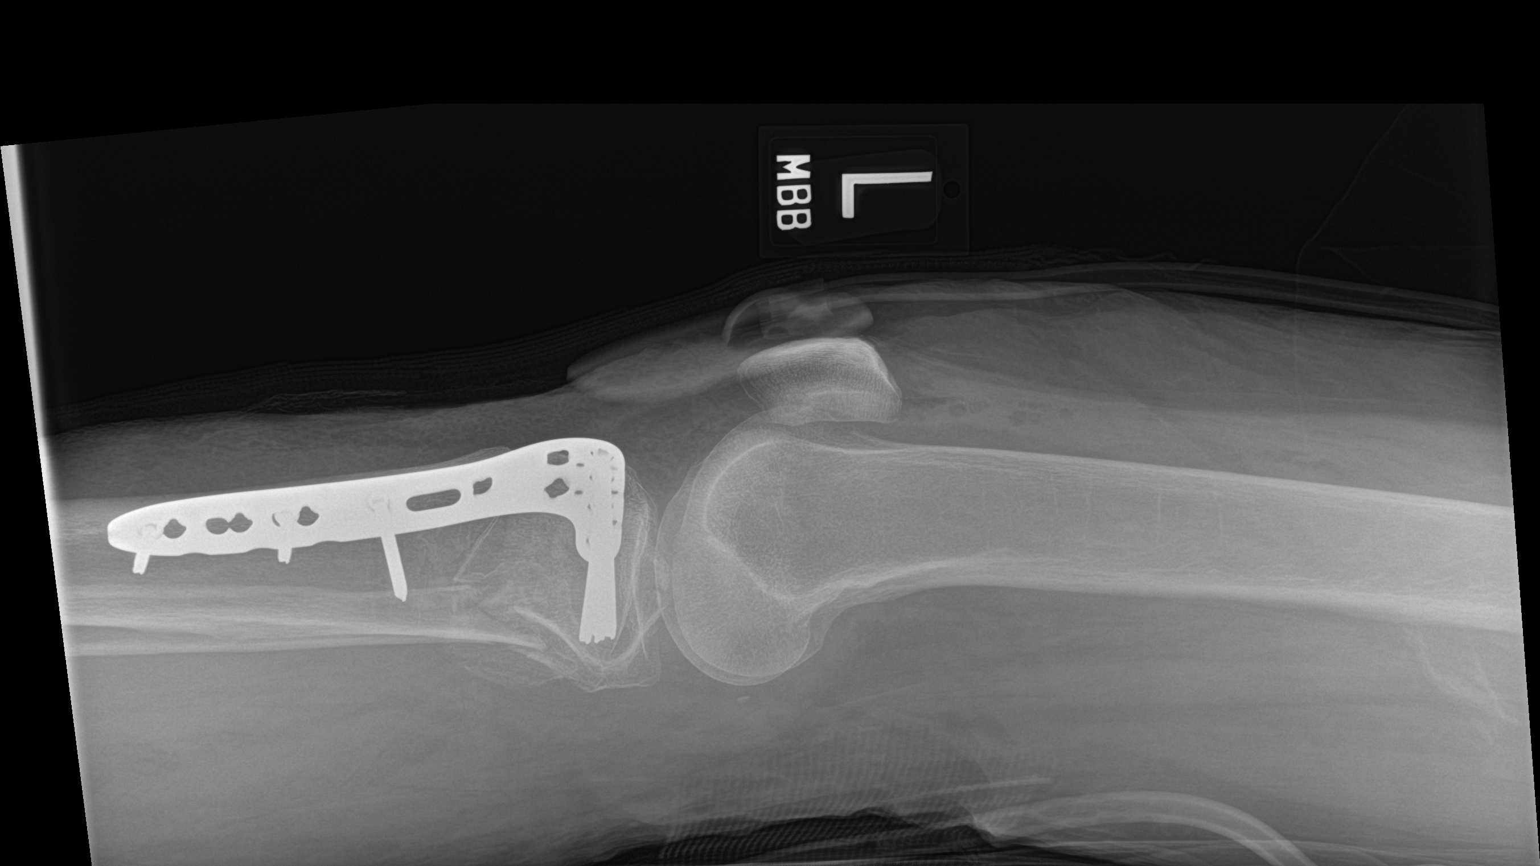

[2 of 2 positions shown; findings below may reference images not displayed]

FINDINGS: Lateral fixation plate and associated screws have reduce the
fractures of the proximal tibia and lateral tibial plateau.
Fractures appear in near anatomic alignment.

No change in the alignment of the comminuted proximal fibular
fracture.

Knee joint is normally aligned.
IMPRESSION: 1. Well aligned proximal left tibial fractures following ORIF.

## 2021-02-22 ENCOUNTER — Other Ambulatory Visit: Payer: Self-pay

## 2021-02-22 ENCOUNTER — Ambulatory Visit (INDEPENDENT_AMBULATORY_CARE_PROVIDER_SITE_OTHER): Payer: Medicare Other

## 2021-02-22 ENCOUNTER — Ambulatory Visit (INDEPENDENT_AMBULATORY_CARE_PROVIDER_SITE_OTHER): Payer: Medicare Other | Admitting: Podiatry

## 2021-02-22 ENCOUNTER — Encounter: Payer: Self-pay | Admitting: Podiatry

## 2021-02-22 DIAGNOSIS — R600 Localized edema: Secondary | ICD-10-CM | POA: Diagnosis not present

## 2021-02-22 DIAGNOSIS — M25571 Pain in right ankle and joints of right foot: Secondary | ICD-10-CM

## 2021-02-22 NOTE — Progress Notes (Signed)
Subjective:   Patient ID: Samantha Hogan, female   DOB: 87 y.o.   MRN: [id number]   HPI Patient presents stating of had swelling in my right ankle that I am concerned about over the last few months and I am not having a lot of pain but I wanted checked.  Patient does not smoke likes to be active   Review of Systems  All other systems reviewed and are negative.       Objective:  Physical Exam Vitals and nursing note reviewed.  Constitutional:      Appearance: She is well-developed.  Pulmonary:     Effort: Pulmonary effort is normal.  Musculoskeletal:        General: Normal range of motion.  Skin:    General: Skin is warm.  Neurological:     Mental Status: She is alert.     Neurovascular status intact muscle strength adequate negative Homans' sign noted with edema +1 pitting of the right midfoot extending into the ankle with no proximal edema noted.  Patient had no history of DVT and does not show symptoms of this and has good digital perfusion     Assessment:  Probability that this is a localized process with possibility she may have had some trauma but difficult to make complete determination     Plan:  H&P x-ray reviewed and I applied ankle compression stocking to try to stop swelling advised on elevation and will reappoint if symptoms indicate and may require medication which I educated her on today  X-rays were negative for signs of fracture or diastases or other pathology injury

## 2021-02-22 NOTE — Patient Instructions (Signed)

## 2022-02-14 ENCOUNTER — Other Ambulatory Visit: Payer: Self-pay | Admitting: Student

## 2022-02-14 DIAGNOSIS — M6281 Muscle weakness (generalized): Secondary | ICD-10-CM

## 2022-02-16 ENCOUNTER — Ambulatory Visit
Admission: RE | Admit: 2022-02-16 | Discharge: 2022-02-16 | Disposition: A | Discharge status: Home / Self Care | Payer: Medicare Other | Source: Ambulatory Visit | Attending: Student | Admitting: Student

## 2022-02-16 DIAGNOSIS — M6281 Muscle weakness (generalized): Secondary | ICD-10-CM

## 2024-03-11 ENCOUNTER — Other Ambulatory Visit: Payer: Self-pay | Admitting: Neurology

## 2024-03-11 DIAGNOSIS — R413 Other amnesia: Secondary | ICD-10-CM

## 2024-03-16 ENCOUNTER — Ambulatory Visit
Admission: RE | Admit: 2024-03-16 | Discharge: 2024-03-16 | Disposition: A | Discharge status: Home / Self Care | Source: Ambulatory Visit | Attending: Neurology | Admitting: Neurology

## 2024-03-16 DIAGNOSIS — R413 Other amnesia: Secondary | ICD-10-CM
